# Patient Record
Sex: Male | Born: 1970 | Race: Black or African American | Hispanic: No | Marital: Single | Smoking: Never smoker
Health system: Southern US, Community
[De-identification: ages and names within clinical notes are randomized; demographics above are authoritative.]

## PROBLEM LIST (undated history)

## (undated) DIAGNOSIS — I10 Essential (primary) hypertension: Secondary | ICD-10-CM

---

## 2015-11-22 ENCOUNTER — Encounter: Payer: Self-pay | Admitting: Emergency Medicine

## 2015-11-22 DIAGNOSIS — I160 Hypertensive urgency: Secondary | ICD-10-CM | POA: Insufficient documentation

## 2015-11-22 DIAGNOSIS — R51 Headache: Secondary | ICD-10-CM | POA: Insufficient documentation

## 2015-11-22 NOTE — ED Notes (Signed)
Pt says he had a headache 2 days ago but it's gone away; here with girlfriend and her son who have both also checked in to be see and his girlfriend wants him checked out too; pt says he has a history of HTN but has not taken his medications for over 2 months; pt denies any symptoms at this time; hypertensive in triage 213/138

## 2015-11-23 ENCOUNTER — Emergency Department
Admission: EM | Admit: 2015-11-23 | Discharge: 2015-11-23 | Disposition: A | Discharge status: Home / Self Care | Payer: Self-pay | Attending: Emergency Medicine | Admitting: Emergency Medicine

## 2015-11-23 DIAGNOSIS — I160 Hypertensive urgency: Secondary | ICD-10-CM

## 2015-11-23 HISTORY — DX: Essential (primary) hypertension: I10

## 2015-11-23 MED ORDER — LISINOPRIL 40 MG PO TABS
40.0000 mg | ORAL_TABLET | Freq: Once | ORAL | Status: AC
  Administered 2015-11-23: 40 mg via ORAL
  Filled 2015-11-23 (×2): qty 1

## 2015-11-23 MED ORDER — LISINOPRIL 40 MG PO TABS: 40.0000 mg | ORAL_TABLET | Freq: Every day | ORAL | Status: DC

## 2015-11-23 NOTE — ED Provider Notes (Signed)
[facility] Emergency Department Provider Note   ____________________________________________  Time seen:  I have reviewed the triage vital signs and the triage nursing note.  HISTORY  Chief Complaint Headache and Hypertension   Historian Patient  [name] is a 44 y.o. male obese African-American male with a history of previous chronic hypertension, who is been out of his medications now for about 2 months after losing his insurance. A couple days ago he had a mild headache and took ibuprofen and it went away. He has not had any chest pain, trouble breathing, abdominal pain, sweats, confusion, weakness, numbness, or vision problems. He is currently asymptomatic. He is here for evaluation because his girlfriend is being checked in the ER, and asked him to take evaluated for his blood pressure since she's been off of his medications. He was previously on lisinopril although is unsure of the dose, and he has also previously been on a medication that starts with an "a," but he doesn't remember was called.    Past Medical History  Diagnosis Date  . Hypertension     There are no active problems to display for this patient.   History reviewed. No pertinent past surgical history.  Current Outpatient Rx  Name  Route  Sig  Dispense  Refill  . lisinopril (PRINIVIL,ZESTRIL) 40 MG tablet   Oral   Take 1 tablet (40 mg total) by mouth daily.   30 tablet   1     Allergies Review of patient's allergies indicates no known allergies.  History reviewed. No pertinent family history.  Social History Social History  Substance Use Topics  . Smoking status: Never Smoker   . Smokeless tobacco: None  . Alcohol Use: Yes    Review of Systems  Constitutional: Negative for fever. Eyes: Negative for visual changes. ENT: Negative for sore throat. Cardiovascular: Negative for chest pain. Respiratory: Negative for shortness of breath. Gastrointestinal:  Negative for abdominal pain, vomiting and diarrhea. Genitourinary: Negative for dysuria. Musculoskeletal: Negative for back pain. Skin: Negative for rash. Neurological: Negative for headache at present. 10 point Review of Systems otherwise negative ____________________________________________   PHYSICAL EXAM:  VITAL SIGNS: ED Triage Vitals  Enc Vitals Group     BP 11/22/15 2347 213/138 mmHg     Pulse Rate 11/22/15 2347 99     Resp 11/22/15 2347 20     Temp 11/22/15 2347 98.8 F (37.1 C)     Temp Source 11/22/15 2347 Oral     SpO2 11/22/15 2347 99 %     Weight 11/22/15 2347 350 lb (158.759 kg)     Height 11/22/15 2347 6' (1.829 m)     Head Cir --      Peak Flow --      Pain Score 11/22/15 2348 0     Pain Loc --      Pain Edu? --      Excl. in GC? --      Constitutional: Alert and oriented. Well appearing and in no distress. Eyes: Conjunctivae are normal. PERRL. Normal extraocular movements. ENT   Head: Normocephalic and atraumatic.   Nose: No congestion/rhinnorhea.   Mouth/Throat: Mucous membranes are moist.   Neck: No stridor. Cardiovascular/Chest: Normal rate, regular rhythm.  No murmurs, rubs, or gallops. Respiratory: Normal respiratory effort without tachypnea nor retractions. Breath sounds are clear and equal bilaterally. No wheezes/rales/rhonchi. Gastrointestinal: Soft. No distention, no guarding, no rebound. Nontender.  Obese.  Genitourinary/rectal:Deferred Musculoskeletal: Nontender with normal range of motion in  all extremities. No joint effusions.  No lower extremity tenderness.  No edema. Neurologic:  Normal speech and language. No gross or focal neurologic deficits are appreciated. Skin:  Skin is warm, dry and intact. No rash noted. Psychiatric: Mood and affect are normal. Speech and behavior are normal. Patient exhibits appropriate insight and judgment.  ____________________________________________   EKG I, Governor Rooksebecca Cherrell Maybee, MD, the attending  physician have personally viewed and interpreted all ECGs.  100 bpm. Normal sinus rhythm. Narrow QRS. Normal axis. Nonspecific ST-T wave. ____________________________________________  LABS (pertinent positives/negatives)  None  ____________________________________________  RADIOLOGY All Xrays were viewed by me. Imaging interpreted by Radiologist.  None __________________________________________  PROCEDURES  Procedure(s) performed: None  Critical Care performed: None  ____________________________________________   ED COURSE / ASSESSMENT AND PLAN  CONSULTATIONS: None  Pertinent labs & imaging results that were available during my care of the patient were reviewed by me and considered in my medical decision making (see chart for details).   Patient is having significantly high blood pressure, with a history of chronic hypertension. He is asymptomatic and has no concerning symptoms at 21 concerned about acute risk of vascular emergency at present. We discussed not breathing blood pressure down to precipitously. I will start him back on lisinopril which she has been on the past. He was given 1 dose here.  Recheck blood pressure 175/110. I've asked him to have his blood pressure rechecked in one week. He was referred to the medical clinic.  Patient / Family / Caregiver informed of clinical course, medical decision-making process, and agree with plan.   I discussed return precautions, follow-up instructions, and discharged instructions with patient and/or family.  ___________________________________________   FINAL CLINICAL IMPRESSION(S) / ED DIAGNOSES   Final diagnoses:  Asymptomatic hypertensive urgency       Governor Rooksebecca Netha Dafoe, MD 11/23/15 [phone]

## 2015-11-23 NOTE — Discharge Instructions (Signed)
You were evaluated for elevated blood pressure, and we discussed since her having no symptoms, urinary be restarted on previous blood pressure medication, lisinopril.  Return to the emergency department for any symptoms including headache, blurry vision, weakness, numbness, slurred speech, confusion, chest pain, dental pain, passing out, sweats, or any other symptoms concerning to you.  You need your blood pressure rechecked and the office, and you are referred to the [facility], in one week.  Hypertension Hypertension, commonly called high blood pressure, is when the force of blood pumping through your arteries is too strong. Your arteries are the blood vessels that carry blood from your heart throughout your body. A blood pressure reading consists of a higher number over a lower number, such as 110/72. The higher number (systolic) is the pressure inside your arteries when your heart pumps. The lower number (diastolic) is the pressure inside your arteries when your heart relaxes. Ideally you want your blood pressure below 120/80. Hypertension forces your heart to work harder to pump blood. Your arteries may become narrow or stiff. Having untreated or uncontrolled hypertension can cause heart attack, stroke, kidney disease, and other problems. RISK FACTORS Some risk factors for high blood pressure are controllable. Others are not.  Risk factors you cannot control include:   Race. You may be at higher risk if you are African American.  Age. Risk increases with age.  Gender. Men are at higher risk than women before age 43 years. After age 43, women are at higher risk than men. Risk factors you can control include:  Not getting enough exercise or physical activity.  Being overweight.  Getting too much fat, sugar, calories, or salt in your diet.  Drinking too much alcohol. SIGNS AND SYMPTOMS Hypertension does not usually cause signs or symptoms. Extremely high blood pressure  (hypertensive crisis) may cause headache, anxiety, shortness of breath, and nosebleed. DIAGNOSIS To check if you have hypertension, your health care provider will measure your blood pressure while you are seated, with your arm held at the level of your heart. It should be measured at least twice using the same arm. Certain conditions can cause a difference in blood pressure between your right and left arms. A blood pressure reading that is higher than normal on one occasion does not mean that you need treatment. If it is not clear whether you have high blood pressure, you may be asked to return on a different day to have your blood pressure checked again. Or, you may be asked to monitor your blood pressure at home for 1 or more weeks. TREATMENT Treating high blood pressure includes making lifestyle changes and possibly taking medicine. Living a healthy lifestyle can help lower high blood pressure. You may need to change some of your habits. Lifestyle changes may include:  Following the DASH diet. This diet is high in fruits, vegetables, and whole grains. It is low in salt, red meat, and added sugars.  Keep your sodium intake below 2,300 mg per day.  Getting at least 30-45 minutes of aerobic exercise at least 4 times per week.  Losing weight if necessary.  Not smoking.  Limiting alcoholic beverages.  Learning ways to reduce stress. Your health care provider may prescribe medicine if lifestyle changes are not enough to get your blood pressure under control, and if one of the following is true:  You are 1918-43 years of age and your systolic blood pressure is above 140.  You are 43 years of age or older, and your  systolic blood pressure is above 150.  Your diastolic blood pressure is above 90.  You have diabetes, and your systolic blood pressure is over 140 or your diastolic blood pressure is over 90.  You have kidney disease and your blood pressure is above 140/90.  You have heart disease  and your blood pressure is above 140/90. Your personal target blood pressure may vary depending on your medical conditions, your age, and other factors. HOME CARE INSTRUCTIONS  Have your blood pressure rechecked as directed by your health care provider.   Take medicines only as directed by your health care provider. Follow the directions carefully. Blood pressure medicines must be taken as prescribed. The medicine does not work as well when you skip doses. Skipping doses also puts you at risk for problems.  Do not smoke.   Monitor your blood pressure at home as directed by your health care provider. SEEK MEDICAL CARE IF:   You think you are having a reaction to medicines taken.  You have recurrent headaches or feel dizzy.  You have swelling in your ankles.  You have trouble with your vision. SEEK IMMEDIATE MEDICAL CARE IF:  You develop a severe headache or confusion.  You have unusual weakness, numbness, or feel faint.  You have severe chest or abdominal pain.  You vomit repeatedly.  You have trouble breathing. MAKE SURE YOU:   Understand these instructions.  Will watch your condition.  Will get help right away if you are not doing well or get worse.   This information is not intended to replace advice given to you by your health care provider. Make sure you discuss any questions you have with your health care provider.   Document Released: 11/29/2005 Document Revised: 04/15/2015 Document Reviewed: 09/21/2013 Elsevier Interactive Patient Education [employer]

## 2016-02-09 ENCOUNTER — Encounter: Payer: Self-pay | Admitting: Emergency Medicine

## 2016-02-09 ENCOUNTER — Emergency Department: Payer: Self-pay

## 2016-02-09 ENCOUNTER — Emergency Department
Admission: EM | Admit: 2016-02-09 | Discharge: 2016-02-09 | Disposition: A | Discharge status: Home / Self Care | Payer: Self-pay | Attending: Emergency Medicine | Admitting: Emergency Medicine

## 2016-02-09 DIAGNOSIS — J810 Acute pulmonary edema: Secondary | ICD-10-CM | POA: Insufficient documentation

## 2016-02-09 DIAGNOSIS — R0600 Dyspnea, unspecified: Secondary | ICD-10-CM

## 2016-02-09 DIAGNOSIS — I10 Essential (primary) hypertension: Secondary | ICD-10-CM | POA: Insufficient documentation

## 2016-02-09 DIAGNOSIS — I160 Hypertensive urgency: Secondary | ICD-10-CM | POA: Insufficient documentation

## 2016-02-09 LAB — COMPREHENSIVE METABOLIC PANEL
ALBUMIN: 3.7 g/dL (ref 3.5–5.0)
ALT: 17 U/L (ref 17–63)
AST: 19 U/L (ref 15–41)
Alkaline Phosphatase: 73 U/L (ref 38–126)
Anion gap: 7 (ref 5–15)
BUN: 13 mg/dL (ref 6–20)
CHLORIDE: 107 mmol/L (ref 101–111)
CO2: 24 mmol/L (ref 22–32)
CREATININE: 1.28 mg/dL — AB (ref 0.61–1.24)
Calcium: 8.6 mg/dL — ABNORMAL LOW (ref 8.9–10.3)
GFR calc Af Amer: >60 mL/min (ref >60)
GLUCOSE: 92 mg/dL (ref 65–99)
POTASSIUM: 3.8 mmol/L (ref 3.5–5.1)
Sodium: 138 mmol/L (ref 135–145)
Total Bilirubin: 0.9 mg/dL (ref 0.3–1.2)
Total Protein: 7.8 g/dL (ref 6.5–8.1)

## 2016-02-09 LAB — URINE DRUG SCREEN, QUALITATIVE (ARMC ONLY)
AMPHETAMINES, UR SCREEN: NOT DETECTED
BARBITURATES, UR SCREEN: NOT DETECTED
BENZODIAZEPINE, UR SCRN: NOT DETECTED
Cannabinoid 50 Ng, Ur ~~LOC~~: NOT DETECTED
Cocaine Metabolite,Ur ~~LOC~~: NOT DETECTED
MDMA (Ecstasy)Ur Screen: NOT DETECTED
METHADONE SCREEN, URINE: NOT DETECTED
OPIATE, UR SCREEN: NOT DETECTED
Phencyclidine (PCP) Ur S: NOT DETECTED
TRICYCLIC, UR SCREEN: NOT DETECTED

## 2016-02-09 LAB — URINALYSIS COMPLETE WITH MICROSCOPIC (ARMC ONLY)
BACTERIA UA: NONE SEEN
Bilirubin Urine: NEGATIVE
Glucose, UA: NEGATIVE mg/dL
Ketones, ur: NEGATIVE mg/dL
LEUKOCYTES UA: NEGATIVE
NITRITE: NEGATIVE
PH: 5 (ref 5.0–8.0)
PROTEIN: NEGATIVE mg/dL
SQUAMOUS EPITHELIAL / LPF: NONE SEEN
Specific Gravity, Urine: 1.003 — ABNORMAL LOW (ref 1.005–1.030)

## 2016-02-09 LAB — CBC
HCT: 41.6 % (ref 40.0–52.0)
Hemoglobin: 13.5 g/dL (ref 13.0–18.0)
MCH: 28.9 pg (ref 26.0–34.0)
MCHC: 32.5 g/dL (ref 32.0–36.0)
MCV: 88.9 fL (ref 80.0–100.0)
PLATELETS: 212 10*3/uL (ref 150–440)
RBC: 4.68 MIL/uL (ref 4.40–5.90)
RDW: 15.2 % — ABNORMAL HIGH (ref 11.5–14.5)
WBC: 8.4 10*3/uL (ref 3.8–10.6)

## 2016-02-09 LAB — TROPONIN I: TROPONIN I: 0.03 ng/mL (ref <0.031)

## 2016-02-09 LAB — BRAIN NATRIURETIC PEPTIDE: B Natriuretic Peptide: 119 pg/mL — ABNORMAL HIGH (ref 0.0–100.0)

## 2016-02-09 MED ORDER — LISINOPRIL 40 MG PO TABS: 40.0000 mg | ORAL_TABLET | Freq: Every day | ORAL | Status: DC

## 2016-02-09 NOTE — Discharge Instructions (Signed)
Shortness of Breath Shortness of breath means you have trouble breathing. It could also mean that you have a medical problem. You should get immediate medical care for shortness of breath. CAUSES   Not enough oxygen in the air such as with high altitudes or a smoke-filled room.  Certain lung diseases, infections, or problems.  Heart disease or conditions, such as angina or heart failure.  Low red blood cells (anemia).  Poor physical fitness, which can cause shortness of breath when you exercise.  Chest or back injuries or stiffness.  Being overweight.  Smoking.  Anxiety, which can make you feel like you are not getting enough air. DIAGNOSIS  Serious medical problems can often be found during your physical exam. Tests may also be done to determine why you are having shortness of breath. Tests may include:  Chest X-rays.  Lung function tests.  Blood tests.  An electrocardiogram (ECG).  An ambulatory electrocardiogram. An ambulatory ECG records your heartbeat patterns over a 24-hour period.  Exercise testing.  A transthoracic echocardiogram (TTE). During echocardiography, sound waves are used to evaluate how blood flows through your heart.  A transesophageal echocardiogram (TEE).  Imaging scans. Your health care provider may not be able to find a cause for your shortness of breath after your exam. In this case, it is important to have a follow-up exam with your health care provider as directed.  TREATMENT  Treatment for shortness of breath depends on the cause of your symptoms and can vary greatly. HOME CARE INSTRUCTIONS   Do not smoke. Smoking is a common cause of shortness of breath. If you smoke, ask for help to quit.  Avoid being around chemicals or things that may bother your breathing, such as paint fumes and dust.  Rest as needed. Slowly resume your usual activities.  If medicines were prescribed, take them as directed for the full length of time directed. This  includes oxygen and any inhaled medicines.  Keep all follow-up appointments as directed by your health care provider. SEEK MEDICAL CARE IF:   Your condition does not improve in the time expected.  You have a hard time doing your normal activities even with rest.  You have any new symptoms. SEEK IMMEDIATE MEDICAL CARE IF:   Your shortness of breath gets worse.  You feel light-headed, faint, or develop a cough not controlled with medicines.  You start coughing up blood.  You have pain with breathing.  You have chest pain or pain in your arms, shoulders, or abdomen.  You have a fever.  You are unable to walk up stairs or exercise the way you normally do. MAKE SURE YOU:  Understand these instructions.  Will watch your condition.  Will get help right away if you are not doing well or get worse.   This information is not intended to replace advice given to you by your health care provider. Make sure you discuss any questions you have with your health care provider.   Document Released: 08/24/2001 Document Revised: 12/04/2013 Document Reviewed: 02/14/2012 Elsevier Interactive Patient Education 2016 Else[employer] Inc[employer]  Pulmonary Edema Pulmonary edema (PE) is a condition in which fluid collects in the lungs. This makes it hard to breathe. PE may be a result of the heart not pumping very well or a result of injury.  CAUSES   Coronary artery disease causes blockages in the arteries of the heart. This deprives the heart muscle of oxygen and weakens the muscle. A heart attack is a form of coronary  artery disease.  High blood pressure causes the heart muscle to work harder than usual. Over time, the heart muscle may get stiff, and it starts to work less efficiently. It may also fatigue and weaken.  Viral infection of the heart (myocarditis) may weaken the heart muscle.  Metabolic conditions such as thyroid disease, excessive alcohol use, certain vitamin deficiencies, or diabetes may  also weaken the heart muscle.  Leaky or stiff heart valves may impair normal heart function.  Lung disease may strain the heart muscle.  Excessive demands on the heart such as too much salt or fluid intake.  Failure to take prescribed medicines.  Lung injury from heat or toxins, such as poisonous gas.  Infection in the lungs or other parts of the body.  Fluid overload caused by kidney failure or medicines. SYMPTOMS   Shortness of breath at rest or with exertion.  Grunting, wheezing, or gurgling while breathing.  Feeling like you cannot get enough air.  Breaths are shallow and fast.  A lot of coughing with frothy or bloody mucus.  Skin may become cool, damp, and turn a pale or bluish color. DIAGNOSIS  Initial diagnosis may be based on your history, symptoms, and a physical examination. Additional tests for PE may include:  Electrocardiography.  Chest X-ray.  Blood tests.  Stress test.  Ultrasound evaluation of the heart (echocardiography).  Evaluation by a heart doctor (cardiologist).  Test of the heart arteries to look for blockages (angiography).  Check of blood oxygen. TREATMENT  Treatment of PE will depend on the underlying cause and will focus first on relieving the symptoms.   Extra oxygen to make breathing easier and assist with removing mucus. This may include breathing treatments or a tube into the lungs and a breathing machine.  Medicine to help the body get rid of extra water, usually through an IV tube.  Medicine to help the heart pump better.  If poor heart function is the cause, treatment may include:  Procedures to open blocked arteries, repair damaged heart valves, or remove some of the damaged heart muscle.  A pacemaker to help the heart pump with less effort. HOME CARE INSTRUCTIONS   Your health care provider will help you determine what type of exercise program may be helpful. It is important to maintain strength and increase it if  possible. Pace your activities to avoid shortness of breath or chest pain. Rest for at least 1 hour before and after meals. Cardiac rehabilitation programs are available in some locations.  Eat a heart-healthy diet low in salt, saturated fat, and cholesterol. Ask for help with choices.  Make a list of every medicine, vitamin, or herbal supplement you are taking. Keep the list with you at all times. Show it to your health care provider at every visit and before starting a new medicine. Keep the list up to date.  Ask your health care provider or pharmacist to help you write a plan or schedule so that you know things about each medicine such as:  Why you are taking it.  The possible side effects.  The best time of day to take it.  Foods to take with it or avoid.  When to stop taking it.  Record your hospital or clinic weight. When you get home, compare it to your scale and record your weight. Then, weigh yourself first thing in the morning daily, and record the weights. You should weigh yourself every morning after you urinate and before you eat breakfast. Wear the  same amount of clothing each time you weigh yourself. Provide your health care provider with your weight record. Daily weights are important in the early recognition of excess fluid. Tell your health care provider right away if you have gained 03 lb/1.4 kg in 1 day, 05 lb/2.3 kg in a week, or as directed by your health care provider. Your medicines may need to be adjusted.  Blood pressure monitoring should be done as often as directed. You can get a home blood pressure cuff at your drugstore. Record these values and bring them with you for your clinic visits. Notify your health care provider if you become dizzy or light-headed when standing up.  If you are currently a smoker, it is time to quit. Nicotine makes your heart work harder and is one of the leading causes of cardiac deaths. Do not use nicotine gum or patches before talking to  your doctor.  Make a follow-up appointment with your health care provider as directed.  Ask your health care provider for a copy of your latest heart tracing (ECG) and keep a copy with you at all times. SEEK IMMEDIATE MEDICAL CARE IF:   You have severe chest pain, especially if the pain is crushing or pressure-like and spreads to the arms, back, neck, or jaw. THIS IS AN EMERGENCY. Do not wait to see if the pain will go away. Call for local emergency medical help. Do not drive yourself to the hospital.  You have sweating, feel sick to your stomach (nauseous), or are experiencing shortness of breath.  Your weight increases by 03 lb/1.4 kg in 1 day or 05 lb/2.3 kg in a week.  You notice increasing shortness of breath that is unusual for you. This may happen during rest, sleep, or with activity.  You develop chest pain (angina) or pain that is unusual for you.  You notice more swelling in your hands, feet, ankles, or abdomen.  You notice lasting (persistent) dizziness, blurred vision, headache, or unsteadiness.  You begin to cough up bloody mucus (sputum).  You are unable to sleep because it is hard to breathe.  You begin to feel a "jumping" or "fluttering" sensation (palpitations) in the chest that is unusual for you. MAKE SURE YOU:  Understand these instructions.  Will watch your condition.  Will get help right away if you are not doing well or get worse.   This information is not intended to replace advice given to you by your health care provider. Make sure you discuss any questions you have with your health care provider.   Document Released: 02/19/2003 Document Revised: 12/04/2013 Document Reviewed: 08/06/2013 Elsevier Interactive Patient Education [employer]

## 2016-02-09 NOTE — ED Notes (Signed)
Admitting doctor took pt off [place] to see if he could keep his sats up. PT satting 90% r/a. Placed back on 2L [place].

## 2016-02-09 NOTE — ED Notes (Signed)
Pt taken off of bipap. Placed on 2 L [place].

## 2016-02-09 NOTE — ED Provider Notes (Signed)
Case discussed with hospitalist Dr. Sherryll Burger who has evaluated the patient twice in the emergency department after removing the patient from BiPAP. As the patient is calm and comfortable with normal work of breathing and oxygen saturation on room air. Recommends refilling lisinopril, referral to PCP, and notes the patient requests a work note.  Will follow-up consult note and plan for outpatient follow-up.    ----------------------------------------- 12:13 PM on 02/09/2016 -----------------------------------------  Consult note reviewed. Recommendations appreciated, we'll relay to the patient. Patient was taken off nasal cannula and oxygen saturation remains stable at 96 or 97% on room air. No increase or worsening of symptoms during that time. Discharge home.  Sharman Cheek, MD 02/09/16 [phone]

## 2016-02-09 NOTE — ED Notes (Signed)
Took patient's family a Medical illustrator

## 2016-02-09 NOTE — ED Provider Notes (Signed)
[facility] Emergency Department Provider Note  ____________________________________________  Time seen: Approximately 535 AM  I have reviewed the triage vital signs and the nursing notes.   HISTORY  Chief Complaint Shortness of Breath    [name] is a 45 y.o. male who comes into the hospital today with some shortness of breath. According to EMS the patient was having sexual relations with his fiance when he started having some trouble breathing. EMS was called and the patient was found to be tripoding with some rales in his lungs. EMS reports that they gave him 40 of Lasix and placed him on BiPAP. He also reports a vague gave him an enalapril and nitroglycerin 2 inches of paste to his chest. The patient's blood pressure on their arrival was 253/144. He reports that it went down to 217/151. His oxygen saturation is 94% on room air but again he was in some acute respiratory distress. The patient denies any chest pain but did have some cough with some phlegm and some wetness in his chest. The patient is had any thing like this happen before. He has a history of high blood pressure for which she is supposed to take lisinopril. The patient is here for evaluation.   Past Medical History  Diagnosis Date  . Hypertension     There are no active problems to display for this patient.   History reviewed. No pertinent past surgical history.  Current Outpatient Rx  Name  Route  Sig  Dispense  Refill  . lisinopril (PRINIVIL,ZESTRIL) 40 MG tablet   Oral   Take 1 tablet (40 mg total) by mouth daily.   30 tablet   1     Allergies Shellfish allergy  No family history on file.  Social History Social History  Substance Use Topics  . Smoking status: Never Smoker   . Smokeless tobacco: None  . Alcohol Use: Yes    Review of Systems Constitutional: No fever/chills Eyes: No visual changes. ENT: No sore throat. Cardiovascular: Denies chest  pain. Respiratory:  shortness of breath. Gastrointestinal: No abdominal pain.  No nausea, no vomiting.  No diarrhea.  No constipation. Genitourinary: Negative for dysuria. Musculoskeletal: Negative for back pain. Skin: Negative for rash. Neurological: Negative for headaches, focal weakness or numbness.  10-point ROS otherwise negative.  ____________________________________________   PHYSICAL EXAM:  VITAL SIGNS: ED Triage Vitals  Enc Vitals Group     BP 02/09/16 0553 181/123 mmHg     Pulse Rate 02/09/16 0553 104     Resp 02/09/16 0553 23     Temp 02/09/16 0553 98.2 F (36.8 C)     Temp Source 02/09/16 0553 Axillary     SpO2 02/09/16 0553 100 %     Weight 02/09/16 0553 350 lb (158.759 kg)     Height 02/09/16 0553 6' (1.829 m)     Head Cir --      Peak Flow --      Pain Score 02/09/16 0556 0     Pain Loc --      Pain Edu? --      Excl. in GC? --     Constitutional: Alert and oriented. Well appearing and in water distress. Eyes: Conjunctivae are normal. PERRL. EOMI. Head: Atraumatic. Nose: No congestion/rhinnorhea. Mouth/Throat: Mucous membranes are moist.  Oropharynx non-erythematous. Cardiovascular: Normal rate, regular rhythm. Grossly normal heart sounds.  Good peripheral circulation. Respiratory: Increased respiratory effort.  No retractions. Coarse breath sounds with some rhonchi in bilateral bases and  some mild crackles. Gastrointestinal: Soft and nontender. No distention. Positive bowel sounds Musculoskeletal: No lower extremity tenderness nor edema.  Neurologic:  Normal speech and language.  Skin:  Skin is warm, dry and intact.  Psychiatric: Mood and affect are normal.   ____________________________________________   LABS (all labs ordered are listed, but only abnormal results are displayed)  Labs Reviewed  CBC - Abnormal; Notable for the following:    RDW 15.2 (*)    All other components within normal limits  COMPREHENSIVE METABOLIC PANEL - Abnormal;  Notable for the following:    Creatinine, Ser 1.28 (*)    Calcium 8.6 (*)    All other components within normal limits  BRAIN NATRIURETIC PEPTIDE - Abnormal; Notable for the following:    B Natriuretic Peptide 119.0 (*)    All other components within normal limits  URINALYSIS COMPLETEWITH MICROSCOPIC ([facility] ONLY) - Abnormal; Notable for the following:    Color, Urine COLORLESS (*)    APPearance CLEAR (*)    Specific Gravity, Urine 1.003 (*)    Hgb urine dipstick 2+ (*)    All other components within normal limits  TROPONIN I  URINE DRUG SCREEN, QUALITATIVE ([facility] ONLY)   ____________________________________________  EKG  ED ECG REPORT I, Rebecka Apley, the attending physician, personally viewed and interpreted this ECG.   Date: 02/09/2016  EKG Time: 542  Rate: 113  Rhythm: sinus tachycardia  Axis: normal  Intervals:none  ST&T Change: none  ____________________________________________  RADIOLOGY  CXR: Mild left basilar airspace opacity may reflect mildly asymmetric interstitial edema or possible pneumonia, Vascular congestion noted. Small left pleural effusion may be present.  ____________________________________________   PROCEDURES  Procedure(s) performed: None  Critical Care performed: No  ____________________________________________   INITIAL IMPRESSION / ASSESSMENT AND PLAN / ED COURSE  Pertinent labs & imaging results that were available during my care of the patient were reviewed by me and considered in my medical decision making (see chart for details).  This is a 45 year old male who comes into the hospital today with some shortness of breath. The concern is that the patient may be having some acute onset of heart failure. The patient's blood pressure severely elevated but he did receive the medication she needed prior to coming into the hospital. I will check his blood work and then reassess the patient.  The patient's BNP is unremarkable but his  chest x-ray does show some edema versus possible pneumonia as well as some vascular congestion. I will contact the hospitalist to admit the patient to the hospital for further evaluation. The patient's blood pressure does remain in the 170s over 100s. ____________________________________________   FINAL CLINICAL IMPRESSION(S) / ED DIAGNOSES  Final diagnoses:  Dyspnea  Acute pulmonary edema (HCC)  Hypertensive urgency      Rebecka Apley, MD 02/09/16 [phone]

## 2016-02-09 NOTE — ED Notes (Signed)
Patient's oxygen level is 96% on room air.

## 2016-02-09 NOTE — ED Notes (Signed)
Pt reports that he and fiance were engaging in sexual activity and pt started getting SOB. Per [facility] pt was tripoding upon arrival at scene. EMS administered 1.52 Ennalopril,  40 Lasix, and two inches nitro paste. Pt is no longer tripoding and is a/o at this time in triage.

## 2016-02-09 NOTE — Consult Note (Signed)
[facility] [place] [facility]   PATIENT NAME: Antonio [name]    MR#:  [id number]  DATE OF BIRTH:  13-Feb-1971  DATE OF ADMISSION:  02/09/2016  PRIMARY CARE PHYSICIAN: Corky Downs MD  REQUESTING/REFERRING PHYSICIAN: Mikey Kirschner MD  CHIEF COMPLAINT:   Chief Complaint  Patient presents with  . Shortness of Breath    HISTORY OF PRESENT ILLNESS:  Antonio [name]  is a 45 y.o. male with a known history of hypertension, was seen in consultation for shortness of breath. According to EMS the patient was having sexual relations with his fiance when he started having some trouble breathing. EMS was called and the patient was found to be tripoding with some rales in his lungs. EMS reports that they gave him 40 of Lasix and placed him on BiPAP. He also reports that they gave him an enalapril and nitroglycerin 2 inches of paste to his chest. The patient's blood pressure on their arrival was 253/144. He reports that it went down to 217/151. His oxygen saturation is 94% on room air but again he was in some acute respiratory distress. The patient denies any chest pain but did have some cough with some phlegm and some wetness in his chest. The patient is had any thing like this happen before. He has a history of high blood pressure for which she is supposed to take lisinopril.  He has ran out of his lisinopril to 3 weeks ago.  He was initially placed on BiPAP but has been doing fine, so has been taken off his been saturating anywhere from 90-92% on room air.  Denies any other symptoms.  Has been feeling back to his baseline.  His significant other is also at bedside, agreeing that he is back to his baseline.  They feel he was too excited while having sexual relation which could have caused him to have his blood pressure go up. PAST MEDICAL HISTORY:   Past Medical History  Diagnosis Date  . Hypertension     PAST SURGICAL HISTOIRY:  History reviewed. No pertinent past  surgical history. None SOCIAL HISTORY:   Social History  Substance Use Topics  . Smoking status: Never Smoker   . Smokeless tobacco: Not on file  . Alcohol Use: Yes   works as a Restaurant manager, fast food role in [employer]  FAMILY HISTORY:  No family history on file. Father and mother had cancer, died in 64s DRUG ALLERGIES:   Allergies  Allergen Reactions  . Shellfish Allergy     N/V    REVIEW OF SYSTEMS:  CONSTITUTIONAL: No fever, fatigue or weakness.  EYES: No blurred or double vision.  EARS, NOSE, AND THROAT: No tinnitus or ear pain.  RESPIRATORY: No cough, shortness of breath, wheezing or hemoptysis.  CARDIOVASCULAR: No chest pain, orthopnea, edema.  GASTROINTESTINAL: No nausea, vomiting, diarrhea or abdominal pain.  GENITOURINARY: No dysuria, hematuria.  ENDOCRINE: No polyuria, nocturia,  HEMATOLOGY: No anemia, easy bruising or bleeding SKIN: No rash or lesion. MUSCULOSKELETAL: No joint pain or arthritis.   NEUROLOGIC: No tingling, numbness, weakness.  PSYCHIATRY: No anxiety or depression.  MEDICATIONS AT HOME:   Prior to Admission medications   Medication Sig Start Date End Date Taking? Authorizing Provider  lisinopril (PRINIVIL,ZESTRIL) 40 MG tablet Take 1 tablet (40 mg total) by mouth daily. 02/09/16   Sharman Cheek, MD      VITAL SIGNS:  Blood pressure 165/114, pulse 89, temperature 98.2 F (36.8 C), temperature source Axillary, resp. rate 18, height 6' (  1.829 m), weight 158.759 kg (350 lb), SpO2 95 %.  PHYSICAL EXAMINATION:  GENERAL:  45 y.o.-year-morbidly obese patient lying in the bed with no acute distress.  EYES: Pupils equal, round, reactive to light and accommodation. No scleral icterus. Extraocular muscles intact.  HEENT: Head atraumatic, normocephalic. Oropharynx and nasopharynx clear.  NECK:  Supple, no jugular venous distention. No thyroid enlargement, no tenderness.  LUNGS: Normal breath sounds bilaterally, no wheezing, rales,rhonchi or crepitation. No  use of accessory muscles of respiration.  CARDIOVASCULAR: S1, S2 normal. No murmurs, rubs, or gallops.  ABDOMEN: Soft, nontender, nondistended. Bowel sounds present. No organomegaly or mass.  EXTREMITIES: No pedal edema, cyanosis, or clubbing.  NEUROLOGIC: Cranial nerves II through XII are intact. Muscle strength 5/5 in all extremities. Sensation intact. Gait not checked.  PSYCHIATRIC: The patient is alert and oriented x 3.  SKIN: No obvious rash, lesion, or ulcer.  LABORATORY PANEL:   CBC  Recent Labs Lab 02/09/16 0553  WBC 8.4  HGB 13.5  HCT 41.6  PLT 212   ------------------------------------------------------------------------------------------------------------------  Chemistries   Recent Labs Lab 02/09/16 0553  NA 138  K 3.8  CL 107  CO2 24  GLUCOSE 92  BUN 13  CREATININE 1.28*  CALCIUM 8.6*  AST 19  ALT 17  ALKPHOS 73  BILITOT 0.9   ------------------------------------------------------------------------------------------------------------------  Cardiac Enzymes  Recent Labs Lab 02/09/16 0553  TROPONINI 0.03   ------------------------------------------------------------------------------------------------------------------  RADIOLOGY:  Dg Chest Portable 1 View  02/09/2016  CLINICAL DATA:  Acute onset of shortness of breath. Initial encounter. EXAM: PORTABLE CHEST 1 VIEW COMPARISON:  None. FINDINGS: The lungs are well-aerated. Mild left basilar airspace opacity may reflect mild asymmetric interstitial edema or possibly pneumonia. Vascular congestion is noted. A small left pleural effusion may be present. No pneumothorax is seen. The cardiomediastinal silhouette is borderline normal in size. No acute osseous abnormalities are seen. IMPRESSION: Mild left basilar airspace opacity may reflect mildly asymmetric interstitial edema or possibly pneumonia. Vascular congestion noted. Small left pleural effusion may be present. Electronically Signed   By: Roanna Raider  M.D.   On: 02/09/2016 07:00    EKG:   Orders placed or performed during the hospital encounter of 02/09/16  . ED EKG  . ED EKG  . EKG 12-Lead  . EKG 12-Lead    IMPRESSION AND PLAN:  45 year old African-American male seen in consultation for acute onset shortness of breath  * Acute onset hypoxia: Likely due to flash pulmonary edema from hypertensive emergency, likely from peak at sexual activity.  He has already had received Lasix along with nitroglycerin and lisinopril, which has helped his blood pressure.  Also on BiPAP and now has been saturating 90-92%.  Please note this patient likely has underlying sleep apnea based on my discussion with him, will need outpatient evaluation for same. May Benefit from CPAP, especially at night.  Did request ED P Dr. Zenda Alpers to get urine drug screen to check for any illicit drugs.  * Hypertensive emergency: Now resolved.  His last blood pressure was in 150s to 160s, I would recommend discharging him on 40 mg of lisinopril, which he has been out, certainly could add hydralazine 10 mg by mouth 3-4 times a day.  Have him follow up with new primary care Dr. Cleatis Polka who can also help manage his blood pressure  * Suspected sleep apnea: Recommend outpatient sleep studies and/overnight pulse oximetry   Dr. Scotty Court has picked up the care of this patient.  I have discussed the management  plan with him.  He will be likely discharged from the ED with outpatient follow-up with Dr. Harl Bowie as a new primary care and/or cardiologist.  He will be given prescription for his blood pressure medication.  Also.   All the records are reviewed and case discussed with Consulting provider. Management plans discussed with the patient, family and they are in agreement.  CODE STATUS: Full code  TOTAL TIME TAKING CARE OF THIS PATIENT: 45 minutes.    [facility], Nesreen Albano M.D on 02/09/2016 at 10:46 AM  Between 7am to 6pm - Pager - [phone]  After 6pm go to [email or web address] - password  [email or web address]  [facility]  Office  [phone]  CC: Primary care Physician: Corky Downs MD (NEW PCP)  Note: This dictation was prepared with Dragon dictation along with smaller phrase technology. Any transcriptional errors that result from this process are unintentional.

## 2016-02-13 ENCOUNTER — Emergency Department
Admission: EM | Admit: 2016-02-13 | Discharge: 2016-02-13 | Disposition: A | Discharge status: Home / Self Care | Payer: Self-pay | Attending: Emergency Medicine | Admitting: Emergency Medicine

## 2016-02-13 ENCOUNTER — Encounter: Payer: Self-pay | Admitting: Emergency Medicine

## 2016-02-13 DIAGNOSIS — T464X5A Adverse effect of angiotensin-converting-enzyme inhibitors, initial encounter: Secondary | ICD-10-CM | POA: Insufficient documentation

## 2016-02-13 DIAGNOSIS — I10 Essential (primary) hypertension: Secondary | ICD-10-CM | POA: Insufficient documentation

## 2016-02-13 DIAGNOSIS — T783XXA Angioneurotic edema, initial encounter: Secondary | ICD-10-CM | POA: Insufficient documentation

## 2016-02-13 MED ORDER — HYDROCHLOROTHIAZIDE 25 MG PO TABS
25.0000 mg | ORAL_TABLET | Freq: Every day | ORAL | Status: DC
  Administered 2016-02-13: 25 mg via ORAL

## 2016-02-13 MED ORDER — HYDROCHLOROTHIAZIDE 25 MG PO TABS: 25.0000 mg | ORAL_TABLET | Freq: Every day | ORAL | Status: AC

## 2016-02-13 MED ORDER — HYDROCHLOROTHIAZIDE 25 MG PO TABS
ORAL_TABLET | ORAL | Status: AC
  Administered 2016-02-13: 25 mg via ORAL
  Filled 2016-02-13: qty 1

## 2016-02-13 NOTE — ED Notes (Signed)
AAOx3.  Skin warm and dry.  Ambulates with easy and steady gait.  No SOB/ DOE.  REspirations regular and non labored.

## 2016-02-13 NOTE — ED Provider Notes (Signed)
[facility] Emergency Department Provider Note     Time seen: ----------------------------------------- 12:41 PM on 02/13/2016 -----------------------------------------    I have reviewed the triage vital signs and the nursing notes.   HISTORY  Chief Complaint Oral Swelling    [name] is a 45 y.o. male who presents to ER for lower lip swelling. Patient states he just started back on lisinopril.Patient states she's had a reaction to an ACE inhibitor before, denies any difficulty breathing or swallowing. Patient states prior he had lip swelling as well. He denies any recent illness, denies any other recent changes in his medicines.   Past Medical History  Diagnosis Date  . Hypertension     There are no active problems to display for this patient.   History reviewed. No pertinent past surgical history.  Allergies Lisinopril and Shellfish allergy  Social History Social History  Substance Use Topics  . Smoking status: Never Smoker   . Smokeless tobacco: None  . Alcohol Use: Yes    Review of Systems Constitutional: Negative for fever. Eyes: Negative for visual changes. ENT: Negative for sore throat. Positive for lip swelling Cardiovascular: Negative for chest pain. Respiratory: Negative for shortness of breath. Gastrointestinal: Negative for abdominal pain, vomiting and diarrhea. Genitourinary: Negative for dysuria. Musculoskeletal: Negative for back pain. Skin: Negative for rash. Neurological: Negative for headaches, focal weakness or numbness.  10-point ROS otherwise negative.  ____________________________________________   PHYSICAL EXAM:  VITAL SIGNS: ED Triage Vitals  Enc Vitals Group     BP 02/13/16 1219 166/113 mmHg     Pulse Rate 02/13/16 1219 82     Resp 02/13/16 1219 20     Temp 02/13/16 1219 98.6 F (37 C)     Temp Source 02/13/16 1219 Oral     SpO2 --      Weight 02/13/16 1219 350 lb (158.759 kg)   Height 02/13/16 1219 6' (1.829 m)     Head Cir --      Peak Flow --      Pain Score --      Pain Loc --      Pain Edu? --      Excl. in GC? --     Constitutional: Alert and oriented. Well appearing and in no distress. Eyes: Conjunctivae are normal. PERRL. Normal extraocular movements. ENT   Head: Normocephalic and atraumatic.   Nose: No congestion/rhinnorhea.   Mouth/Throat: Mucous membranes are moist. Lower lip swelling and edema particularly on the left, no intraoral or posterior pharyngeal swelling   Neck: No stridor. Cardiovascular: Normal rate, regular rhythm. Normal and symmetric distal pulses are present in all extremities. No murmurs, rubs, or gallops. Respiratory: Normal respiratory effort without tachypnea nor retractions. Breath sounds are clear and equal bilaterally. No wheezes/rales/rhonchi. Gastrointestinal: Soft and nontender. No distention. No abdominal bruits.  Musculoskeletal: Nontender with normal range of motion in all extremities. No joint effusions.  No lower extremity tenderness nor edema. Neurologic:  Normal speech and language. No gross focal neurologic deficits are appreciated. Speech is normal. No gait instability. Skin:  Skin is warm, dry and intact. No rash noted. Psychiatric: Mood and affect are normal. Speech and behavior are normal. Patient exhibits appropriate insight and judgment. ____________________________________________  ED COURSE:  Pertinent labs & imaging results that were available during my care of the patient were reviewed by me and considered in my medical decision making (see chart for details). Patient is in no acute distress, clearly angioedema from ACE inhibitor. 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 I have placed Ace inhibitors on his chart as being an allergy. I will change his blood pressure medication and encouraged  close follow-up with his doctor.  Emily Filbert, MD   Emily Filbert, MD 02/13/16 [phone]

## 2016-02-13 NOTE — ED Notes (Signed)
Swelling in lower lip, just started taking lisinopril

## 2016-02-13 NOTE — Discharge Instructions (Signed)
Angioedema  Angioedema is a sudden swelling of tissues, often of the skin. It can occur on the face or genitals or in the abdomen or other body parts. The swelling usually develops over a short period and gets better in 24 to 48 hours. It often begins during the night and is found when the person wakes up. The person may also get red, itchy patches of skin (hives). Angioedema can be dangerous if it involves swelling of the air passages.   Depending on the cause, episodes of angioedema may only happen once, come back in unpredictable patterns, or repeat for several years and then gradually fade away.   CAUSES   Angioedema can be caused by an allergic reaction to various triggers. It can also result from nonallergic causes, including reactions to drugs, immune system disorders, viral infections, or an abnormal gene that is passed to you from your parents (hereditary). For some people with angioedema, the cause is unknown.   Some things that can trigger angioedema include:    Foods.    Medicines, such as ACE inhibitors, ARBs, nonsteroidal anti-inflammatory agents, or estrogen.    Latex.    Animal saliva.    Insect stings.    Dyes used in X-rays.    Mild injury.    Dental work.   Surgery.   Stress.    Sudden changes in temperature.    Exercise.  SIGNS AND SYMPTOMS    Swelling of the skin.   Hives. If these are present, there is also intense itching.   Redness in the affected area.    Pain in the affected area.   Swollen lips or tongue.   Breathing problems. This may happen if the air passages swell.   Wheezing.  If internal organs are involved, there may be:    Nausea.    Abdominal pain.    Vomiting.    Difficulty swallowing.    Difficulty passing urine.  DIAGNOSIS    Your health care provider will examine the affected area and take a medical and family history.   Various tests may be done to help determine the cause. Tests may include:   Allergy skin tests to see if the problem  is an allergic reaction.    Blood tests to check for hereditary angioedema.    Tests to check for underlying diseases that could cause the condition.    A review of your medicines, including over-the-counter medicines, may be done.  TREATMENT   Treatment will depend on the cause of the angioedema. Possible treatments include:    Removal of anything that triggered the condition (such as stopping certain medicines).    Medicines to treat symptoms or prevent attacks. Medicines given may include:     Antihistamines.     Epinephrine injection.     Steroids.    Hospitalization may be required for severe attacks. If the air passages are affected, it can be an emergency. Tubes may need to be placed to keep the airway open.  HOME CARE INSTRUCTIONS    Take all medicines as directed by your health care provider.   If you were given medicines for emergency allergy treatment, always carry them with you.   Wear a medical bracelet as directed by your health care provider.    Avoid known triggers.  SEEK MEDICAL CARE IF:    You have repeat attacks of angioedema.    Your attacks are more frequent or more severe despite preventive measures.    You have hereditary angioedema   and are considering having children. It is important to discuss with your health care provider the risks of passing the condition on to your children.  SEEK IMMEDIATE MEDICAL CARE IF:    You have severe swelling of the mouth, tongue, or lips.   You have difficulty breathing.    You have difficulty swallowing.    You faint.  MAKE SURE YOU:   Understand these instructions.   Will watch your condition.   Will get help right away if you are not doing well or get worse.     This information is not intended to replace advice given to you by your health care provider. Make sure you discuss any questions you have with your health care provider.     Document Released: 02/07/2002 Document Revised: 12/20/2014 Document Reviewed:  07/23/2013  Elsevier Interactive Patient Education 2016 [employer]

## 2016-02-13 NOTE — ED Notes (Signed)
Dr. Mayford KnifeWilliams notified of patient's VS and BP.  OK To discharge.

## 2016-04-03 ENCOUNTER — Emergency Department: Payer: Self-pay

## 2016-04-03 ENCOUNTER — Emergency Department
Admission: EM | Admit: 2016-04-03 | Discharge: 2016-04-03 | Disposition: A | Discharge status: Home / Self Care | Payer: Self-pay | Attending: Emergency Medicine | Admitting: Emergency Medicine

## 2016-04-03 DIAGNOSIS — R05 Cough: Secondary | ICD-10-CM | POA: Insufficient documentation

## 2016-04-03 DIAGNOSIS — G4733 Obstructive sleep apnea (adult) (pediatric): Secondary | ICD-10-CM | POA: Insufficient documentation

## 2016-04-03 DIAGNOSIS — Z202 Contact with and (suspected) exposure to infections with a predominantly sexual mode of transmission: Secondary | ICD-10-CM

## 2016-04-03 DIAGNOSIS — J301 Allergic rhinitis due to pollen: Secondary | ICD-10-CM | POA: Insufficient documentation

## 2016-04-03 DIAGNOSIS — I10 Essential (primary) hypertension: Secondary | ICD-10-CM | POA: Insufficient documentation

## 2016-04-03 DIAGNOSIS — R059 Cough, unspecified: Secondary | ICD-10-CM

## 2016-04-03 DIAGNOSIS — I517 Cardiomegaly: Secondary | ICD-10-CM | POA: Insufficient documentation

## 2016-04-03 DIAGNOSIS — A599 Trichomoniasis, unspecified: Secondary | ICD-10-CM | POA: Insufficient documentation

## 2016-04-03 MED ORDER — LORATADINE 10 MG PO TABS: 10.0000 mg | ORAL_TABLET | Freq: Every day | ORAL | Status: AC

## 2016-04-03 MED ORDER — METRONIDAZOLE 500 MG PO TABS: 2000.0000 mg | ORAL_TABLET | Freq: Once | ORAL | Status: AC

## 2016-04-03 NOTE — Discharge Instructions (Signed)
Cough, Adult A cough helps to clear your throat and lungs. A cough may last only 2-3 weeks (acute), or it may last longer than 8 weeks (chronic). Many different things can cause a cough. A cough may be a sign of an illness or another medical condition. HOME CARE  Pay attention to any changes in your cough.  Take medicines only as told by your doctor.  If you were prescribed an antibiotic medicine, take it as told by your doctor. Do not stop taking it even if you start to feel better.  Talk with your doctor before you try using a cough medicine.  Drink enough fluid to keep your pee (urine) clear or pale yellow.  If the air is dry, use a cold steam vaporizer or humidifier in your home.  Stay away from things that make you cough at work or at home.  If your cough is worse at night, try using extra pillows to raise your head up higher while you sleep.  Do not smoke, and try not to be around smoke. If you need help quitting, ask your doctor.  Do not have caffeine.  Do not drink alcohol.  Rest as needed. GET HELP IF:  You have new problems (symptoms).  You cough up yellow fluid (pus).  Your cough does not get better after 2-3 weeks, or your cough gets worse.  Medicine does not help your cough and you are not sleeping well.  You have pain that gets worse or pain that is not helped with medicine.  You have a fever.  You are losing weight and you do not know why.  You have night sweats. GET HELP RIGHT AWAY IF:  You cough up blood.  You have trouble breathing.  Your heartbeat is very fast.   This information is not intended to replace advice given to you by your health care provider. Make sure you discuss any questions you have with your health care provider.   Document Released: 08/12/2011 Document Revised: 08/20/2015 Document Reviewed: 02/05/2015 Elsevier Interactive Patient Education 2016 Elsevier Inc.  DASH Eating Plan DASH stands for "Dietary Approaches to Stop  Hypertension." The DASH eating plan is a healthy eating plan that has been shown to reduce high blood pressure (hypertension). Additional health benefits may include reducing the risk of type 2 diabetes mellitus, heart disease, and stroke. The DASH eating plan may also help with weight loss. WHAT DO I NEED TO KNOW ABOUT THE DASH EATING PLAN? For the DASH eating plan, you will follow these general guidelines:  Choose foods with a percent daily value for sodium of less than 5% (as listed on the food label).  Use salt-free seasonings or herbs instead of table salt or sea salt.  Check with your health care provider or pharmacist before using salt substitutes.  Eat lower-sodium products, often labeled as "lower sodium" or "no salt added."  Eat fresh foods.  Eat more vegetables, fruits, and low-fat dairy products.  Choose whole grains. Look for the word "whole" as the first word in the ingredient list.  Choose fish and skinless chicken or [place] more often than red meat. Limit fish, poultry, and meat to 6 oz (170 g) each day.  Limit sweets, desserts, sugars, and sugary drinks.  Choose heart-healthy fats.  Limit cheese to 1 oz (28 g) per day.  Eat more home-cooked food and less restaurant, buffet, and fast food.  Limit fried foods.  Cook foods using methods other than frying.  Limit canned vegetables. If  you do use them, rinse them well to decrease the sodium.  When eating at a restaurant, ask that your food be prepared with less salt, or no salt if possible. WHAT FOODS CAN I EAT? Seek help from a dietitian for individual calorie needs. Grains Whole grain or whole wheat bread. Brown rice. Whole grain or whole wheat pasta. Quinoa, bulgur, and whole grain cereals. Low-sodium cereals. Corn or whole wheat flour tortillas. Whole grain cornbread. Whole grain crackers. Low-sodium crackers. Vegetables Fresh or frozen vegetables (raw, steamed, roasted, or grilled). Low-sodium or  reduced-sodium tomato and vegetable juices. Low-sodium or reduced-sodium tomato sauce and paste. Low-sodium or reduced-sodium canned vegetables.  Fruits All fresh, canned (in natural juice), or frozen fruits. Meat and Other Protein Products Ground beef (85% or leaner), grass-fed beef, or beef trimmed of fat. Skinless chicken or [place]. Ground chicken or [place]. Pork trimmed of fat. All fish and seafood. Eggs. Dried beans, peas, or lentils. Unsalted nuts and seeds. Unsalted canned beans. Dairy Low-fat dairy products, such as skim or 1% milk, 2% or reduced-fat cheeses, low-fat ricotta or cottage cheese, or plain low-fat yogurt. Low-sodium or reduced-sodium cheeses. Fats and Oils Tub margarines without trans fats. Light or reduced-fat mayonnaise and salad dressings (reduced sodium). Avocado. Safflower, olive, or canola oils. Natural peanut or almond butter. Other Unsalted popcorn and pretzels. The items listed above may not be a complete list of recommended foods or beverages. Contact your dietitian for more options. WHAT FOODS ARE NOT RECOMMENDED? Grains White bread. White pasta. White rice. Refined cornbread. Bagels and croissants. Crackers that contain trans fat. Vegetables Creamed or fried vegetables. Vegetables in a cheese sauce. Regular canned vegetables. Regular canned tomato sauce and paste. Regular tomato and vegetable juices. Fruits Dried fruits. Canned fruit in light or heavy syrup. Fruit juice. Meat and Other Protein Products Fatty cuts of meat. Ribs, chicken wings, bacon, sausage, bologna, salami, chitterlings, fatback, hot dogs, bratwurst, and packaged luncheon meats. Salted nuts and seeds. Canned beans with salt. Dairy Whole or 2% milk, cream, half-and-half, and cream cheese. Whole-fat or sweetened yogurt. Full-fat cheeses or blue cheese. Nondairy creamers and whipped toppings. Processed cheese, cheese spreads, or cheese curds. Condiments Onion and garlic salt, seasoned salt,  table salt, and sea salt. Canned and packaged gravies. Worcestershire sauce. Tartar sauce. Barbecue sauce. Teriyaki sauce. Soy sauce, including reduced sodium. Steak sauce. Fish sauce. Oyster sauce. Cocktail sauce. Horseradish. Ketchup and mustard. Meat flavorings and tenderizers. Bouillon cubes. Hot sauce. Tabasco sauce. Marinades. Taco seasonings. Relishes. Fats and Oils Butter, stick margarine, lard, shortening, ghee, and bacon fat. Coconut, palm kernel, or palm oils. Regular salad dressings. Other Pickles and olives. Salted popcorn and pretzels. The items listed above may not be a complete list of foods and beverages to avoid. Contact your dietitian for more information. WHERE CAN I FIND MORE INFORMATION? National Heart, Lung, and Blood Institute: [employer]   This information is not intended to replace advice given to you by your health care provider. Make sure you discuss any questions you have with your health care provider.   Document Released: 11/18/2011 Document Revised: 12/20/2014 Document Reviewed: 10/03/2013 Elsevier Interactive Patient Education 2016 [employer].  Hypertension Hypertension is another name for high blood pressure. High blood pressure forces your heart to work harder to pump blood. A blood pressure reading has two numbers, which includes a higher number over a lower number (example: 110/72). HOME CARE   Have your blood pressure rechecked by your doctor.  Only take medicine as told by  your doctor. Follow the directions carefully. The medicine does not work as well if you skip doses. Skipping doses also puts you at risk for problems.  Do not smoke.  Monitor your blood pressure at home as told by your doctor. GET HELP IF:  You think you are having a reaction to the medicine you are taking.  You have repeat headaches or feel dizzy.  You have puffiness (swelling) in your ankles.  You have trouble with your  vision. GET HELP RIGHT AWAY IF:   You get a very bad headache and are confused.  You feel weak, numb, or faint.  You get chest or belly (abdominal) pain.  You throw up (vomit).  You cannot breathe very well. MAKE SURE YOU:   Understand these instructions.  Will watch your condition.  Will get help right away if you are not doing well or get worse.   This information is not intended to replace advice given to you by your health care provider. Make sure you discuss any questions you have with your health care provider.   Document Released: 05/17/2008 Document Revised: 12/04/2013 Document Reviewed: 09/21/2013 Elsevier Interactive Patient Education 2016 Elsevier Inc.  Sleep Apnea Sleep apnea is disorder that affects a person's sleep. A person with sleep apnea has abnormal pauses in their breathing when they sleep. It is hard for them to get a good sleep. This makes a person tired during the day. It also can lead to other physical problems. There are three types of sleep apnea. One type is when breathing stops for a short time because your airway is blocked (obstructive sleep apnea). Another type is when the brain sometimes fails to give the normal signal to breathe to the muscles that control your breathing (central sleep apnea). The third type is a combination of the other two types. HOME CARE  Do not sleep on your back. Try to sleep on your side.  Take all medicine as told by your doctor.  Avoid alcohol, calming medicines (sedatives), and depressant drugs.  Try to lose weight if you are overweight. Talk to your doctor about a healthy weight goal. Your doctor may have you use a device that helps to open your airway. It can help you get the air that you need. It is called a positive airway pressure (PAP) device. There are three types of PAP devices:  Continuous positive airway pressure (CPAP) device.  Nasal expiratory positive airway pressure (EPAP) device.  Bilevel positive  airway pressure (BPAP) device. MAKE SURE YOU:  Understand these instructions.  Will watch your condition.  Will get help right away if you are not doing well or get worse.   This information is not intended to replace advice given to you by your health care provider. Make sure you discuss any questions you have with your health care provider.   Document Released: 09/07/2008 Document Revised: 12/20/2014 Document Reviewed: 04/01/2012 Elsevier Interactive Patient Education 2016 [employer].  Trichomoniasis Trichomoniasis is an infection caused by an organism called Trichomonas. The infection can affect both women and men. In women, the outer male genitalia and the vagina are affected. In men, the penis is mainly affected, but the prostate and other reproductive organs can also be involved. Trichomoniasis is a sexually transmitted infection (STI) and is most often passed to another person through sexual contact.  RISK FACTORS  Having unprotected sexual intercourse.  Having sexual intercourse with an infected partner. SIGNS AND SYMPTOMS  Symptoms of trichomoniasis in women include:  Abnormal  gray-green frothy vaginal discharge.  Itching and irritation of the vagina.  Itching and irritation of the area outside the vagina. Symptoms of trichomoniasis in men include:   Penile discharge with or without pain.  Pain during urination. This results from inflammation of the urethra. DIAGNOSIS  Trichomoniasis may be found during a Pap test or physical exam. Your health care provider may use one of the following methods to help diagnose this infection:  Testing the pH of the vagina with a test tape.  Using a vaginal swab test that checks for the Trichomonas organism. A test is available that provides results within a few minutes.  Examining a urine sample.  Testing vaginal secretions. Your health care provider may test you for other STIs, including HIV. TREATMENT   You may be given  medicine to fight the infection. Women should inform their health care provider if they could be or are pregnant. Some medicines used to treat the infection should not be taken during pregnancy.  Your health care provider may recommend over-the-counter medicines or creams to decrease itching or irritation.  Your sexual partner will need to be treated if infected.  Your health care provider may test you for infection again 3 months after treatment. HOME CARE INSTRUCTIONS   Take medicines only as directed by your health care provider.  Take over-the-counter medicine for itching or irritation as directed by your health care provider.  Do not have sexual intercourse while you have the infection.  Women should not douche or wear tampons while they have the infection.  Discuss your infection with your partner. Your partner may have gotten the infection from you, or you may have gotten it from your partner.  Have your sex partner get examined and treated if necessary.  Practice safe, informed, and protected sex.  See your health care provider for other STI testing. SEEK MEDICAL CARE IF:   You still have symptoms after you finish your medicine.  You develop abdominal pain.  You have pain when you urinate.  You have bleeding after sexual intercourse.  You develop a rash.  Your medicine makes you sick or makes you throw up (vomit). MAKE SURE YOU:  Understand these instructions.  Will watch your condition.  Will get help right away if you are not doing well or get worse.   This information is not intended to replace advice given to you by your health care provider. Make sure you discuss any questions you have with your health care provider.   Document Released: 05/25/2001 Document Revised: 12/20/2014 Document Reviewed: 09/10/2013 Elsevier Interactive Patient Education [employer]

## 2016-04-03 NOTE — ED Notes (Signed)
Patient to ER for cough x2-3 weeks.

## 2016-04-03 NOTE — ED Notes (Signed)
Pt cough X 3 weeks, pt wants to be tested for trichomonas, fiancee dx with it last week, no sx. Pt alert and oriented X4, active, cooperative, pt in NAD. RR even and unlabored, color WNL.

## 2016-04-03 NOTE — ED Provider Notes (Signed)
[facility] Emergency Department Provider Note  ____________________________________________  Time seen: Approximately 11:06 AM  I have reviewed the triage vital signs and the nursing notes.   HISTORY  Chief Complaint Cough    [name] is a 45 y.o. male , NAD, presents to the emergency department with a dry cough 3 weeks. States he believes it is related to increased pollen. Has had one episode in which she was able to produce clear mucus. Has not taken anything over-the-counter to decrease his cough. Has not had any fevers, chills, body aches. Denies any chest pain, back pain, shortness breath, wheezing. Has a history of uncontrolled hypertension and states he is on 2 antihypertensive medications in which he takes daily. Is uncertain of who his primary care provider is but did see someone outside of this emergency department within the last month. States he's been seen in this emergency department 2 in regards to his hypertension. Also notes that he snores at night but is uncertain if he has any difficulty breathing while sleeping.  Also states that he was told by his fiance that she was diagnosed with Trichomonas last week. He is requesting testing and treatment for such. Denies any dysuria, hematuria, urethral discharge no changes in his urinary habits.   Past Medical History  Diagnosis Date  . Hypertension     There are no active problems to display for this patient.   History reviewed. No pertinent past surgical history.  Current Outpatient Rx  Name  Route  Sig  Dispense  Refill  . hydrochlorothiazide (HYDRODIURIL) 25 MG tablet   Oral   Take 1 tablet (25 mg total) by mouth daily.   30 tablet   1   . loratadine (CLARITIN) 10 MG tablet   Oral   Take 1 tablet (10 mg total) by mouth daily.   30 tablet   0   . metroNIDAZOLE (FLAGYL) 500 MG tablet   Oral   Take 4 tablets (2,000 mg total) by mouth once.   4 tablet   0      Allergies Lisinopril and Shellfish allergy  No family history on file.  Social History Social History  Substance Use Topics  . Smoking status: Never Smoker   . Smokeless tobacco: None  . Alcohol Use: Yes     Review of Systems  Constitutional: No fever/chills or fatigue Eyes: No visual changes. No discharge, redness, swelling ENT: No sore throat, ear pain, sinus pressure, runny nose, nasal congestion. Cardiovascular: No chest pain, palpitations. Respiratory: Positive cough but no chest congestion. No shortness of breath. No wheezing.  Gastrointestinal: No abdominal pain.  No nausea, vomiting.  Genitourinary:  No urethral discharge, dysuria, hematuria. No increased urinary frequency, urinary hesitancy, urinary see. Musculoskeletal: Negative for back pain.  Skin: Negative for rash. Neurological: Negative for headaches, focal weakness or numbness. 10-point ROS otherwise negative.  ____________________________________________   PHYSICAL EXAM:  VITAL SIGNS: ED Triage Vitals  Enc Vitals Group     BP 04/03/16 1051 207/116 mmHg     Pulse Rate 04/03/16 1051 87     Resp 04/03/16 1051 14     Temp 04/03/16 1051 98.3 F (36.8 C)     Temp Source 04/03/16 1051 Oral     SpO2 04/03/16 1051 97 %     Weight 04/03/16 1051 350 lb (158.759 kg)     Height --      Head Cir --      Peak Flow --  Pain Score --      Pain Loc --      Pain Edu? --      Excl. in GC? --      Constitutional: Alert and oriented. Well appearing and in no acute distress. Obese with a wide neck and brought shoulders. Eyes: Conjunctivae are normal. PERRL. EOMI without pain.  Head: Atraumatic. ENT:      Ears: TMs visualized bilaterally without effusion, erythema, perforation, bulging.      Nose: No congestion/rhinnorhea.      Mouth/Throat: Mucous membranes are moist. Pharynx without erythema, swelling, exudate. Uvula midline. Clear postnasal drip. Large tongue. Neck: No stridor. Supple with full range  of motion. Hematological/Lymphatic/Immunilogical: No cervical lymphadenopathy. Cardiovascular: Normal rate, regular rhythm. Normal S1 and S2. No murmurs, rubs, gallops. Good peripheral circulation with 2+ pulses in bilateral lower extremities. Respiratory: Normal respiratory effort without tachypnea or retractions. Lungs CTAB with breath sounds noted in all lung fields. Musculoskeletal: No lower extremity tenderness nor edema.  No joint effusions. Neurologic:  Normal speech and language. No gross focal neurologic deficits are appreciated.  Skin:  Skin is warm, dry and intact. No rash noted. Psychiatric: Mood and affect are normal. Speech and behavior are normal. Patient exhibits appropriate insight and judgement.   ____________________________________________   LABS  None ____________________________________________  EKG  None ____________________________________________  RADIOLOGY I have personally viewed and evaluated these images (plain radiographs) as part of my medical decision making, as well as reviewing the written report by the radiologist.  Dg Chest 2 View  04/03/2016  CLINICAL DATA:  Pt states he has been coughing X 3 weeks now. Pt states he does not have any history of Pulmonary Edema. Nonsmoker. Hx of HTN. EXAM: CHEST  2 VIEW COMPARISON:  02/09/2016 FINDINGS: Heart is mildly enlarged. There is no focal consolidations pleural effusions. No pulmonary edema. Mild mid thoracic spondylosis noted. IMPRESSION: Cardiomegaly. Electronically Signed   By: Norva Pavlov M.D.   On: 04/03/2016 11:39    ____________________________________________    PROCEDURES  Procedure(s) performed: None      Medications - No data to display   ____________________________________________   INITIAL IMPRESSION / ASSESSMENT AND PLAN / ED COURSE  Pertinent imaging results that were available during my care of the patient were reviewed by me and considered in my medical decision making  (see chart for details).  Patient's diagnosis is consistent with cough, allergic rhinitis in which she was prescribed loratadine to take as directed. Patient was given a prescription for Flagyl to take 1 dose to treat for potential trichomonas infection. He was given information to follow-up with the [facility] health Department for further STD testing. Patient also with cardiomegaly on chest x-ray, continued uncontrolled essential hypertension. Anticipate patient's uncontrolled hypertension could be related to obesity as well as potential of underlying obstructive sleep apnea. Advised the patient that he should see his primary care provider within the week for follow-up of this emergency department visit. Advise that he has sleep testing to rule out obstructive sleep apnea as he may benefit from CPAP at home. He should continue to take his antihypertensives as currently prescribed on a daily basis. At the time of discharge patient was well-appearing and in no apparent distress, therefore outpatient follow-up of current medical concerns is amenable. Patient is given strict ED precautions to return to the ED for any worsening or new symptoms.      ____________________________________________  FINAL CLINICAL IMPRESSION(S) / ED DIAGNOSES  Final diagnoses:  Cough  Allergic  rhinitis due to pollen  Cardiomegaly  Essential hypertension  Obstructive sleep apnea  Exposure to trichomonas      NEW MEDICATIONS STARTED DURING THIS VISIT:  Discharge Medication List as of 04/03/2016 12:03 PM    START taking these medications   Details  loratadine (CLARITIN) 10 MG tablet Take 1 tablet (10 mg total) by mouth daily., Starting 04/03/2016, Until Discontinued, Print    metroNIDAZOLE (FLAGYL) 500 MG tablet Take 4 tablets (2,000 mg total) by mouth once., Starting 04/03/2016, Print             Hope Pigeon, PA-C 04/03/16 1518  Emily Filbert, MD 04/05/16 [phone]

## 2016-07-12 ENCOUNTER — Emergency Department
Admission: EM | Admit: 2016-07-12 | Discharge: 2016-07-12 | Disposition: A | Discharge status: Home / Self Care | Payer: Medicaid Other | Attending: Emergency Medicine | Admitting: Emergency Medicine

## 2016-07-12 DIAGNOSIS — M5441 Lumbago with sciatica, right side: Secondary | ICD-10-CM | POA: Insufficient documentation

## 2016-07-12 DIAGNOSIS — M25561 Pain in right knee: Secondary | ICD-10-CM | POA: Insufficient documentation

## 2016-07-12 DIAGNOSIS — M5431 Sciatica, right side: Secondary | ICD-10-CM

## 2016-07-12 DIAGNOSIS — Z91013 Allergy to seafood: Secondary | ICD-10-CM | POA: Insufficient documentation

## 2016-07-12 DIAGNOSIS — I10 Essential (primary) hypertension: Secondary | ICD-10-CM | POA: Insufficient documentation

## 2016-07-12 DIAGNOSIS — Z79899 Other long term (current) drug therapy: Secondary | ICD-10-CM | POA: Insufficient documentation

## 2016-07-12 LAB — COMPREHENSIVE METABOLIC PANEL
ALT: 19 U/L (ref 17–63)
AST: 20 U/L (ref 15–41)
Albumin: 3.9 g/dL (ref 3.5–5.0)
Alkaline Phosphatase: 82 U/L (ref 38–126)
Anion gap: 4 — ABNORMAL LOW (ref 5–15)
BILIRUBIN TOTAL: 1 mg/dL (ref 0.3–1.2)
BUN: 11 mg/dL (ref 6–20)
CO2: 28 mmol/L (ref 22–32)
CREATININE: 1.21 mg/dL (ref 0.61–1.24)
Calcium: 9.1 mg/dL (ref 8.9–10.3)
Chloride: 105 mmol/L (ref 101–111)
GFR calc Af Amer: >60 mL/min (ref >60)
Glucose, Bld: 91 mg/dL (ref 65–99)
Potassium: 4 mmol/L (ref 3.5–5.1)
Sodium: 137 mmol/L (ref 135–145)
TOTAL PROTEIN: 8.1 g/dL (ref 6.5–8.1)

## 2016-07-12 MED ORDER — PREDNISONE 10 MG (21) PO TBPK: ORAL_TABLET | ORAL | 0 refills | Status: AC

## 2016-07-12 NOTE — Discharge Instructions (Signed)
Please seek medical attention for any high fevers, chest pain, shortness of breath, change in behavior, persistent vomiting, bloody stool or any other new or concerning symptoms.  

## 2016-07-12 NOTE — ED Notes (Signed)
Discharge instructions reviewed with patient. Questions fielded by this RN. Patient verbalizes understanding of instructions. Patient discharged home in stable condition per Goodman MD . No acute distress noted at time of discharge.   

## 2016-07-12 NOTE — ED Triage Notes (Addendum)
Pt arrives to ER via POV c/o right sided lumbar back pain and right knee pain X1 week. Pt states he is very active at work and is concerned it is related. Denies urinary symptoms. Pt alert and oriented X4, active, cooperative, pt in NAD. RR even and unlabored, color WNL.  Full ROM.  Pt requesting blood pressure medication refill as well.

## 2016-07-13 NOTE — ED Provider Notes (Signed)
[facility] Emergency Department Provider Note   ____________________________________________   I have reviewed the triage vital signs and the nursing notes.   HISTORY  Chief Complaint Back pain and knee pain  History limited by: Not Limited   [name] is a 45 y.o. male who presents to the emergency department today beause of concern for knee and back pain. 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 The patient [name] that the back pain is located on the right side. He does have pain that sometime shoot behind his right thigh. Additionally has pain in his right knee. He [name] that it starts off stiff however after using for a little while seems to get better. He denies any fever. Denies any history of arthritis.    Past Medical History:  Diagnosis Date  . Hypertension     There are no active problems to display for this patient.   History reviewed. No pertinent surgical history.  Prior to Admission medications   Medication Sig Start Date End Date Taking? Authorizing Provider  hydrochlorothiazide (HYDRODIURIL) 25 MG tablet Take 1 tablet (25 mg total) by mouth daily. 02/13/16   Emily Filbert, MD  loratadine (CLARITIN) 10 MG tablet Take 1 tablet (10 mg total) by mouth daily. 04/03/16   Jami L Hagler, PA-C  metroNIDAZOLE (FLAGYL) 500 MG tablet Take 4 tablets (2,000 mg total) by mouth once. 04/03/16   Jami L Hagler, PA-C  predniSONE (STERAPRED UNI-PAK 21 TAB) 10 MG (21) TBPK tablet Take as directed on packaging. 07/12/16   [name], MD    Allergies Lisinopril and Shellfish allergy  No family history on file.  Social History Social History  Substance Use Topics  . Smoking status: Never Smoker  . Smokeless tobacco: Not on file  . Alcohol use Yes    Review of Systems  Constitutional: Negative for fever. Cardiovascular: Negative for chest pain. Respiratory:  Negative for shortness of breath. Gastrointestinal: Negative for abdominal pain, vomiting and diarrhea. Genitourinary: Negative for dysuria. Musculoskeletal: Positive for right lower back pain and right knee pain Skin: Negative for rash. Neurological: Negative for headaches, focal weakness or numbness.  10-point ROS otherwise negative.  ____________________________________________   PHYSICAL EXAM:  VITAL SIGNS: ED Triage Vitals  Enc Vitals Group     BP 07/12/16 1606 (!) 182/116     Pulse Rate 07/12/16 1603 92     Resp 07/12/16 1603 16     Temp 07/12/16 1603 98.6 F (37 C)     Temp Source 07/12/16 1603 Oral     SpO2 07/12/16 1603 98 %     Weight 07/12/16 1603 (!) 350 lb (158.8 kg)     Height 07/12/16 1603 6' (1.829 m)     Head Circumference --      Peak Flow --      Pain Score 07/12/16 1604 4   Constitutional: Alert and oriented. Well appearing and in no distress. Eyes: Conjunctivae are normal. PERRL. Normal extraocular movements. ENT   Head: Normocephalic and atraumatic.   Nose: No congestion/rhinnorhea.   Mouth/Throat: Mucous membranes are moist.   Neck: No stridor. Hematological/Lymphatic/Immunilogical: No cervical lymphadenopathy. Cardiovascular: Normal rate, regular rhythm.  No murmurs, rubs, or gallops. Respiratory: Normal respiratory effort without tachypnea nor retractions. Breath sounds are clear and equal bilaterally. No wheezes/rales/rhonchi. Gastrointestinal: Soft and nontender. No distention. There is no CVA tenderness. Genitourinary: Deferred Musculoskeletal: Normal range of motion in  all extremities. No joint effusions.  Right knee without any warmth, effusion or tenderness. Painless range of motion. No lower extremity tenderness nor edema. Negative straight leg bilaterally Neurologic:  Normal speech and language. No gross focal neurologic deficits are appreciated.  Skin:  Skin is warm, dry and intact. No rash noted. Psychiatric: Mood and affect  are normal. Speech and behavior are normal. Patient exhibits appropriate insight and judgment.  ____________________________________________    LABS (pertinent positives/negatives)  Labs Reviewed  COMPREHENSIVE METABOLIC PANEL - Abnormal; Notable for the following:       Result Value   Anion gap 4 (*)    All other components within normal limits     ____________________________________________   EKG  I, [name], attending physician, personally viewed and interpreted this EKG  EKG Time: 1607 Rate: 81 Rhythm: normal sinus rhythm Axis: left axis deviation Intervals: qtc 439 QRS: LVH ST changes: st elevation V2 likely secondary to LVH Impression: abnormal ekg   ____________________________________________    RADIOLOGY  None  ____________________________________________   PROCEDURES  Procedures  ____________________________________________   INITIAL IMPRESSION / ASSESSMENT AND PLAN / ED COURSE  Pertinent labs & imaging results that were available during my care of the patient were reviewed by me and considered in my medical decision making (see chart for details).  Patient presents to the emergency department today with right low back pain and right knee pain. Patient does work [employer] with heavy lifting. I think likely that the patient's pain is related to arthritis and sciatica. I discussed this with the patient. Will give patient primary care follow-up. ____________________________________________   FINAL CLINICAL IMPRESSION(S) / ED DIAGNOSES  Final diagnoses:  Sciatica of right side  Right knee pain     Note: This dictation was prepared with Dragon dictation. Any transcriptional errors that result from this process are unintentional    [name], MD 07/13/16 2221

## 2017-03-30 ENCOUNTER — Encounter: Payer: Self-pay | Admitting: Emergency Medicine

## 2017-03-30 ENCOUNTER — Emergency Department
Admission: EM | Admit: 2017-03-30 | Discharge: 2017-03-30 | Disposition: A | Discharge status: Home / Self Care | Payer: Self-pay | Attending: Student in an Organized Health Care Education/Training Program | Admitting: Student in an Organized Health Care Education/Training Program

## 2017-03-30 DIAGNOSIS — G8929 Other chronic pain: Secondary | ICD-10-CM | POA: Insufficient documentation

## 2017-03-30 DIAGNOSIS — I10 Essential (primary) hypertension: Secondary | ICD-10-CM | POA: Insufficient documentation

## 2017-03-30 DIAGNOSIS — M5442 Lumbago with sciatica, left side: Secondary | ICD-10-CM | POA: Insufficient documentation

## 2017-03-30 DIAGNOSIS — Z79899 Other long term (current) drug therapy: Secondary | ICD-10-CM | POA: Insufficient documentation

## 2017-03-30 MED ORDER — MELOXICAM 15 MG PO TABS: 15.0000 mg | ORAL_TABLET | Freq: Every day | ORAL | 0 refills | Status: AC

## 2017-03-30 MED ORDER — METOPROLOL TARTRATE 50 MG PO TABS
50.0000 mg | ORAL_TABLET | Freq: Once | ORAL | Status: AC
  Administered 2017-03-30: 50 mg via ORAL
  Filled 2017-03-30: qty 1

## 2017-03-30 MED ORDER — HYDROCHLOROTHIAZIDE 12.5 MG PO CAPS
12.5000 mg | ORAL_CAPSULE | Freq: Every day | ORAL | Status: DC
  Administered 2017-03-30: 12.5 mg via ORAL
  Filled 2017-03-30: qty 1

## 2017-03-30 MED ORDER — HYDROCHLOROTHIAZIDE 12.5 MG PO CAPS: 12.5000 mg | ORAL_CAPSULE | Freq: Every day | ORAL | 0 refills | Status: AC

## 2017-03-30 MED ORDER — KETOROLAC TROMETHAMINE 60 MG/2ML IM SOLN
30.0000 mg | Freq: Once | INTRAMUSCULAR | Status: AC
  Administered 2017-03-30: 30 mg via INTRAMUSCULAR
  Filled 2017-03-30: qty 2

## 2017-03-30 MED ORDER — METOPROLOL TARTRATE 50 MG PO TABS: 50.0000 mg | ORAL_TABLET | Freq: Two times a day (BID) | ORAL | 0 refills | Status: AC

## 2017-03-30 NOTE — ED Notes (Signed)
Pt. States that he has "shooting pain" up the left leg with each step.

## 2017-03-30 NOTE — ED Triage Notes (Signed)
Pt with back pain that started 5 days ago. Pt states that this feels like last time he had back pain. Denies numbness or tingling in legs. Pt also out of BP medicine and bp 206/131.

## 2017-03-30 NOTE — ED Provider Notes (Signed)
[facility] Emergency Department Provider Note  ____________________________________________  Time seen: Approximately 7:08 PM  I have reviewed the triage vital signs and the nursing notes.   HISTORY  Chief Complaint Back Pain    [name] is a 46 y.o. male presents to the emergency department with low left sided back painfor 5 days. Patient states that back pain is improving. He occasionally gets shooting pains that start in low back and radiates down the back of his left leg. He is walking normally. He states that he occasionally gets these back pain flareups and they usually resolve within a week. That pain feels like it usually does. He has been taking ibuprofen, which helps the pain. He has not taken any ibuprofen today because of back pain is getting better on its own. No numbness or tingling. Patient states that he has  hypertension and has not taken blood pressure medicine for several weeks because he is out. He is not sure the name of his medication. He came to the emergency department today because his friend also came to the ED so he "figured he would get himself checked out." Patient denies headache, visual changes, shortness of breath, cough, nausea, vomiting, abdominal pain, calf pain, bowel or bladder dysfunction, dysuria, saddle paresthesias, numbness, tingling.   Past Medical History:  Diagnosis Date  . Hypertension     There are no active problems to display for this patient.   History reviewed. No pertinent surgical history.  Prior to Admission medications   Medication Sig Start Date End Date Taking? Authorizing Provider  hydrochlorothiazide (HYDRODIURIL) 25 MG tablet Take 1 tablet (25 mg total) by mouth daily. 02/13/16   Emily Filbert, MD  hydrochlorothiazide (MICROZIDE) 12.5 MG capsule Take 1 capsule (12.5 mg total) by mouth daily. 03/30/17 03/30/18  Enid Derry, PA-C  loratadine (CLARITIN) 10 MG tablet Take 1 tablet (10 mg  total) by mouth daily. 04/03/16   Jami L Hagler, PA-C  meloxicam (MOBIC) 15 MG tablet Take 1 tablet (15 mg total) by mouth daily. 03/30/17 04/09/17  Enid Derry, PA-C  metoprolol (LOPRESSOR) 50 MG tablet Take 1 tablet (50 mg total) by mouth 2 (two) times daily. 03/30/17 03/30/18  Enid Derry, PA-C  metroNIDAZOLE (FLAGYL) 500 MG tablet Take 4 tablets (2,000 mg total) by mouth once. 04/03/16   Jami L Hagler, PA-C  predniSONE (STERAPRED UNI-PAK 21 TAB) 10 MG (21) TBPK tablet Take as directed on packaging. 07/12/16   [name], MD    Allergies Lisinopril and Shellfish allergy  History reviewed. No pertinent family history.  Social History Social History  Substance Use Topics  . Smoking status: Never Smoker  . Smokeless tobacco: Never Used  . Alcohol use Yes     Review of Systems  Constitutional: No fever/chills Cardiovascular: No chest pain. Respiratory: No SOB. Gastrointestinal: No abdominal pain.  No nausea, no vomiting.  Musculoskeletal: Positive for back pain. Skin: Negative for rash, abrasions, lacerations, ecchymosis. Neurological: Negative for headaches, numbness or tingling   ____________________________________________   PHYSICAL EXAM:  VITAL SIGNS: ED Triage Vitals  Enc Vitals Group     BP 03/30/17 1644 (!) 206/131     Pulse Rate 03/30/17 1644 92     Resp 03/30/17 1855 18     Temp 03/30/17 1644 98.8 F (37.1 C)     Temp Source 03/30/17 1644 Oral     SpO2 03/30/17 1644 97 %     Weight 03/30/17 1646 (!) 315 lb (142.9 kg)  Height 03/30/17 1646 6' (1.829 m)     Head Circumference --      Peak Flow --      Pain Score 03/30/17 1643 6     Pain Loc --      Pain Edu? --      Excl. in GC? --      Constitutional: Alert and oriented. Well appearing and in no acute distress. Eyes: Conjunctivae are normal. PERRL. EOMI. Head: Atraumatic. ENT:      Ears:      Nose: No congestion/rhinnorhea.      Mouth/Throat: Mucous membranes are moist.  Neck: No stridor.    Cardiovascular: Normal rate, regular rhythm.  Good peripheral circulation. Respiratory: Normal respiratory effort without tachypnea or retractions. Lungs CTAB. Good air entry to the bases with no decreased or absent breath sounds. Gastrointestinal: Bowel sounds 4 quadrants. Soft and nontender to palpation. No guarding or rigidity. No palpable masses. No distention. No CVA tenderness. Musculoskeletal: Full range of motion to all extremities. No gross deformities appreciated. No tenderness to palpation over thoracic or lumbar spine. No numbness to palpation over left SI joint. Neurologic:  Normal speech and language. No gross focal neurologic deficits are appreciated.  Skin:  Skin is warm, dry and intact. No rash noted.   ____________________________________________   LABS (all labs ordered are listed, but only abnormal results are displayed)  Labs Reviewed - No data to display ____________________________________________  EKG   ____________________________________________  RADIOLOGY   No results found.  ____________________________________________    PROCEDURES  Procedure(s) performed:    Procedures    Medications  hydrochlorothiazide (MICROZIDE) capsule 12.5 mg (12.5 mg Oral Given 03/30/17 1807)  metoprolol (LOPRESSOR) tablet 50 mg (50 mg Oral Given 03/30/17 1807)  ketorolac (TORADOL) injection 30 mg (30 mg Intramuscular Given 03/30/17 1813)     ____________________________________________   INITIAL IMPRESSION / ASSESSMENT AND PLAN / ED COURSE  Pertinent labs & imaging results that were available during my care of the patient were reviewed by me and considered in my medical decision making (see chart for details).  Review of the [place] CSRS was performed in accordance of the [facility] prior to dispensing any controlled drugs.     Patient's diagnosis is consistent with low back pain with sciatica and hypertension. Back pain has been steadily improving for the last 5  days. Patient was given Toradol injection in ED. He is up walking around the room without difficulty. Patient has been out of hypertension medication for several weeks. After calling [facility], I got his prescription names and doses. 2 weeks worth of his medication will be filled and he is instructed to follow-up with his PCP for refill of his medication and to recheck his regimen. Patient is asymptomatic. Patient will be discharged home with prescriptions for metoprolol, hydrochlorothiazide, meloxicam. Patient is to follow up with PCP as directed. Patient is given ED precautions to return to the ED for any worsening or new symptoms.     ____________________________________________  FINAL CLINICAL IMPRESSION(S) / ED DIAGNOSES  Final diagnoses:  Chronic left-sided low back pain with left-sided sciatica  Hypertension, unspecified type      NEW MEDICATIONS STARTED DURING THIS VISIT:  Discharge Medication List as of 03/30/2017  7:08 PM    START taking these medications   Details  hydrochlorothiazide (MICROZIDE) 12.5 MG capsule Take 1 capsule (12.5 mg total) by mouth daily., Starting Wed 03/30/2017, Until Thu 03/30/2018, Print    meloxicam (MOBIC) 15 MG tablet Take 1 tablet (15 mg  total) by mouth daily., Starting Wed 03/30/2017, Until Sat 04/09/2017, Print    metoprolol (LOPRESSOR) 50 MG tablet Take 1 tablet (50 mg total) by mouth 2 (two) times daily., Starting Wed 03/30/2017, Until Thu 03/30/2018, Print            This chart was dictated using voice recognition software/Dragon. Despite best efforts to proofread, errors can occur which can change the meaning. Any change was purely unintentional.    Enid Derry, PA-C 03/30/17 1916    Willy Eddy, MD 04/01/17 1150

## 2017-06-10 IMAGING — CR DG CHEST 1V PORT
1 series · 1 of 1 positions shown · non-contrast
Comparison: None.

CLINICAL DATA: Acute onset of shortness of breath. Initial
encounter.

EXAM:
PORTABLE CHEST 1 VIEW

[portable]
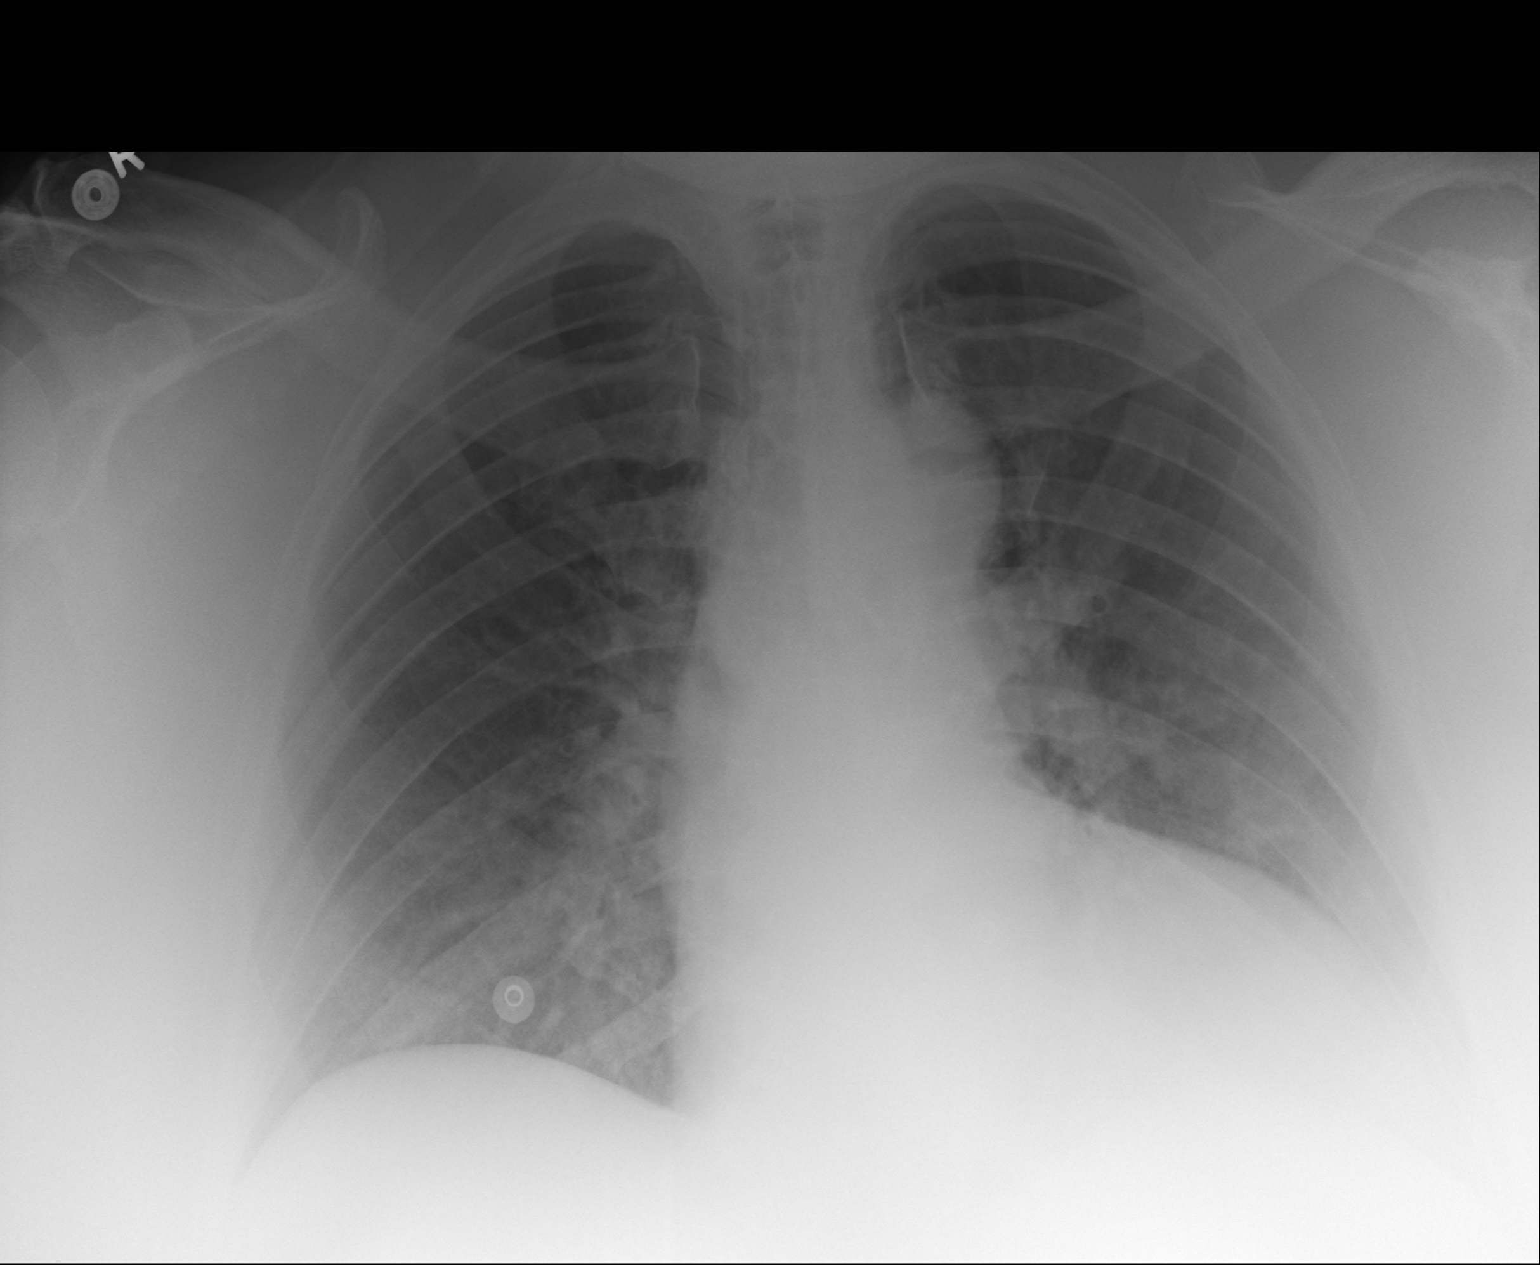

[1 of 1 positions shown; findings below may reference images not displayed]

FINDINGS: The lungs are well-aerated. Mild left basilar airspace opacity may
reflect mild asymmetric interstitial edema or possibly pneumonia.
Vascular congestion is noted. A small left pleural effusion may be
present. No pneumothorax is seen.

The cardiomediastinal silhouette is borderline normal in size. No
acute osseous abnormalities are seen.
IMPRESSION: Mild left basilar airspace opacity may reflect mildly asymmetric
interstitial edema or possibly pneumonia. Vascular congestion noted.
Small left pleural effusion may be present.

## 2017-08-03 IMAGING — CR DG CHEST 2V
1 series · 2 of 2 positions shown · non-contrast
Comparison: 02/09/2016

CLINICAL DATA: Pt states he has been coughing X 3 weeks now. Pt
states he does not have any history of Pulmonary Edema. Nonsmoker.
Hx of HTN.

EXAM:
CHEST  2 VIEW

[Series 1: dg chest 2 view · 0.14mm/px · 2 of 2 slices shown]
[im 1/2]
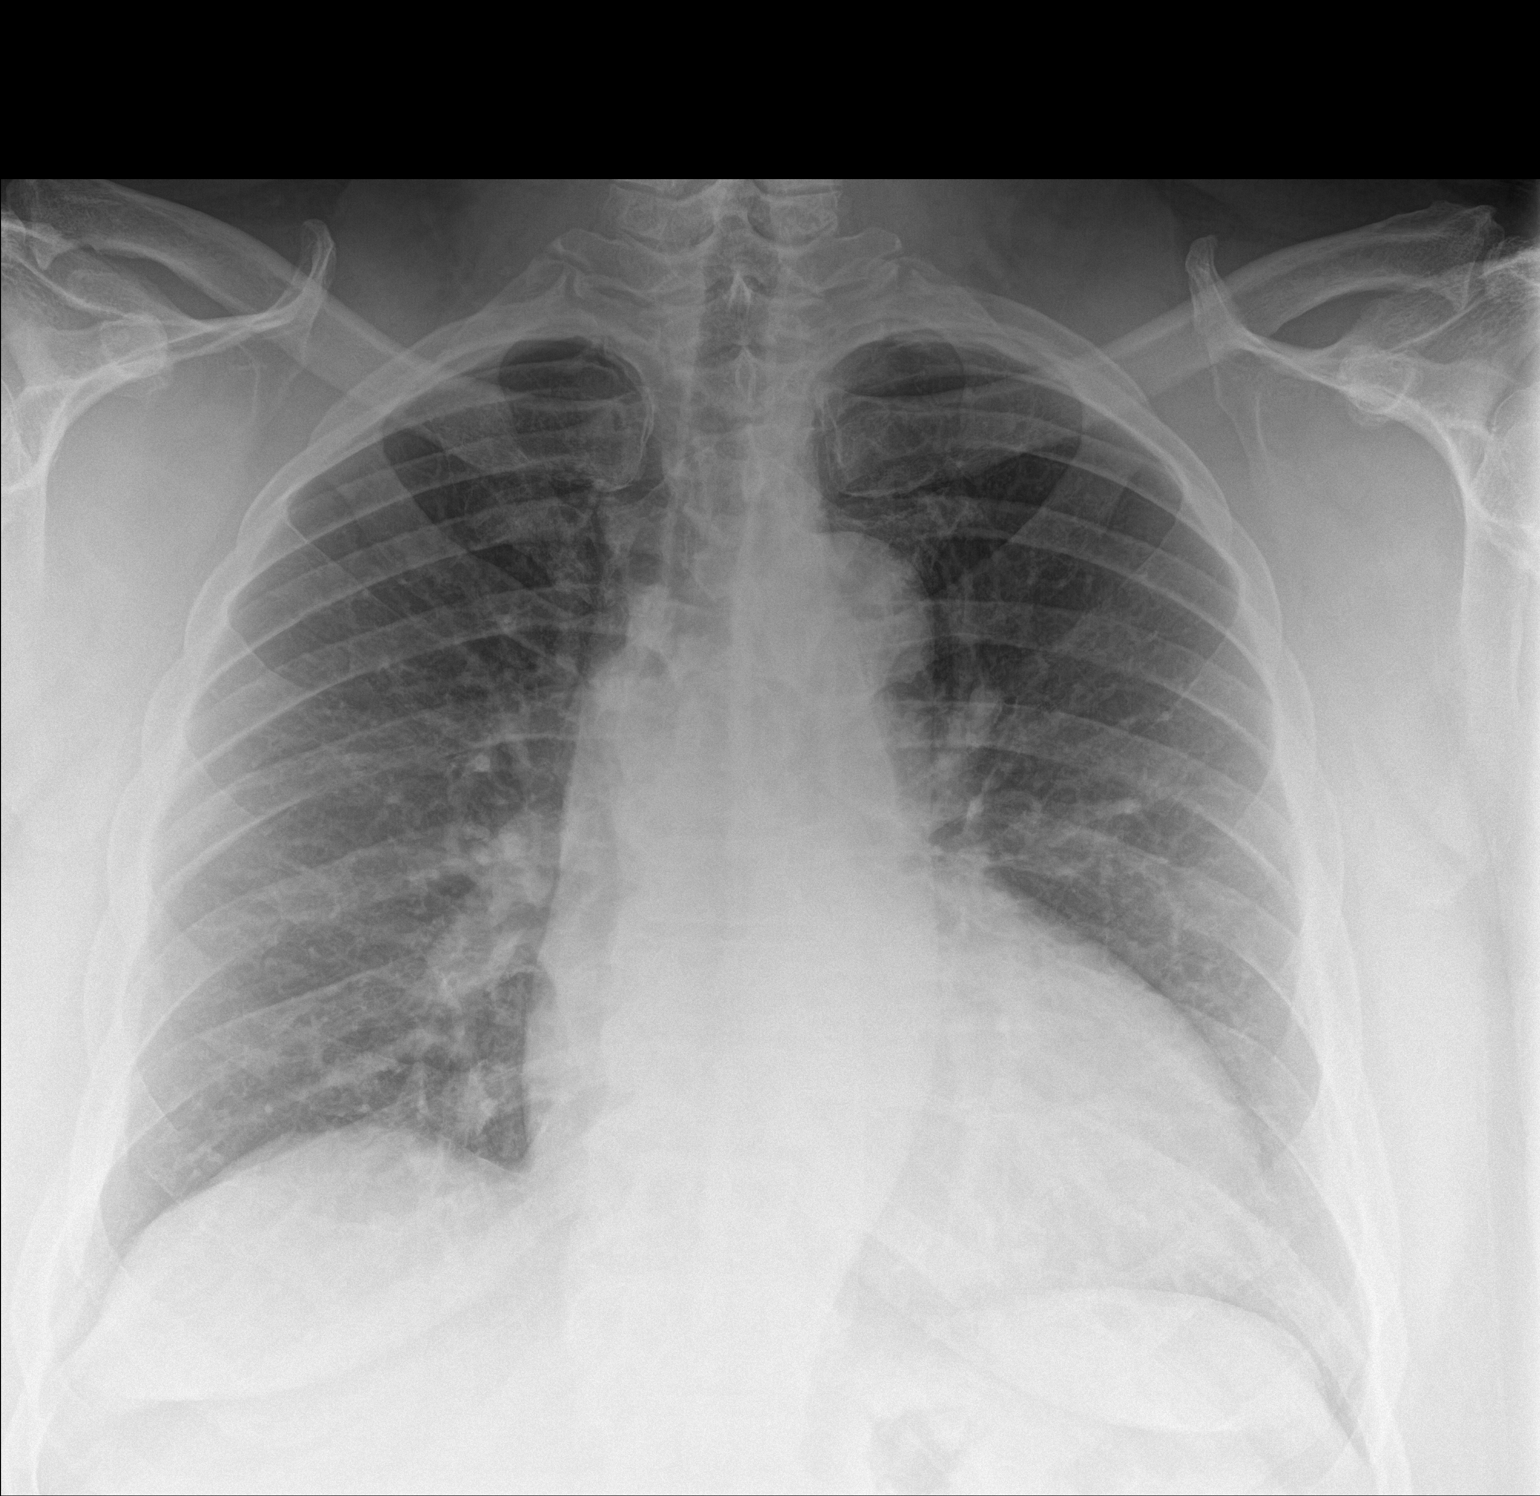
[im 2/2]
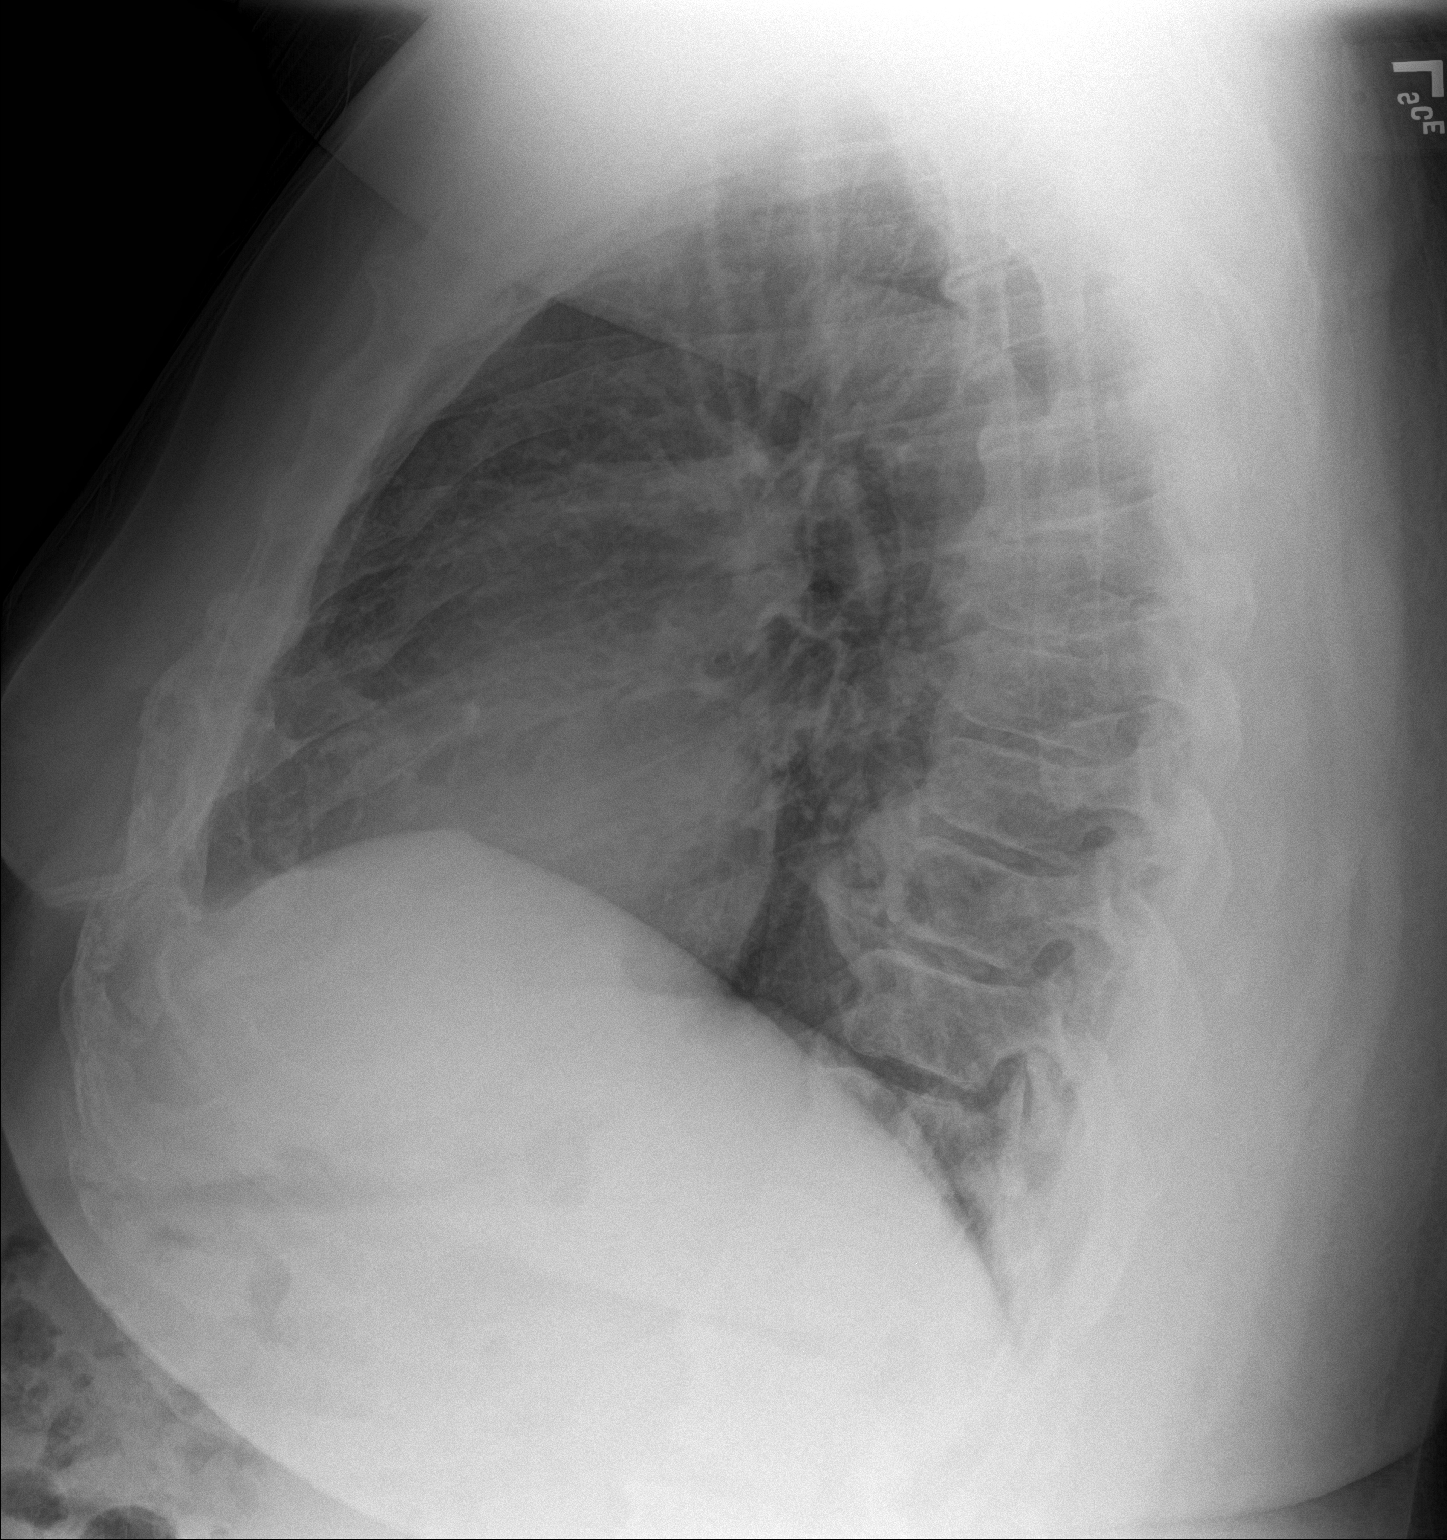

[2 of 2 positions shown; findings below may reference images not displayed]

FINDINGS: Heart is mildly enlarged. There is no focal consolidations pleural
effusions. No pulmonary edema. Mild mid thoracic spondylosis noted.
IMPRESSION: Cardiomegaly.
